# Patient Record
Sex: Female | Born: 1980 | Race: White | Hispanic: No | Marital: Married | Smoking: Former smoker
Health system: Southern US, Community
[De-identification: ages and names within clinical notes are randomized; demographics above are authoritative.]

## PROBLEM LIST (undated history)

## (undated) DIAGNOSIS — M5126 Other intervertebral disc displacement, lumbar region: Secondary | ICD-10-CM

## (undated) DIAGNOSIS — R519 Headache, unspecified: Secondary | ICD-10-CM

## (undated) DIAGNOSIS — M5116 Intervertebral disc disorders with radiculopathy, lumbar region: Secondary | ICD-10-CM

## (undated) DIAGNOSIS — D649 Anemia, unspecified: Secondary | ICD-10-CM

## (undated) DIAGNOSIS — R001 Bradycardia, unspecified: Secondary | ICD-10-CM

## (undated) DIAGNOSIS — E041 Nontoxic single thyroid nodule: Secondary | ICD-10-CM

## (undated) DIAGNOSIS — I10 Essential (primary) hypertension: Secondary | ICD-10-CM

## (undated) DIAGNOSIS — G8929 Other chronic pain: Secondary | ICD-10-CM

## (undated) DIAGNOSIS — M5136 Other intervertebral disc degeneration, lumbar region: Secondary | ICD-10-CM

## (undated) DIAGNOSIS — Z973 Presence of spectacles and contact lenses: Secondary | ICD-10-CM

## (undated) DIAGNOSIS — Z87442 Personal history of urinary calculi: Secondary | ICD-10-CM

## (undated) DIAGNOSIS — Z972 Presence of dental prosthetic device (complete) (partial): Secondary | ICD-10-CM

## (undated) DIAGNOSIS — Z8742 Personal history of other diseases of the female genital tract: Secondary | ICD-10-CM

## (undated) DIAGNOSIS — N200 Calculus of kidney: Secondary | ICD-10-CM

## (undated) DIAGNOSIS — R51 Headache: Secondary | ICD-10-CM

## (undated) DIAGNOSIS — R87619 Unspecified abnormal cytological findings in specimens from cervix uteri: Secondary | ICD-10-CM

## (undated) HISTORY — DX: Anemia, unspecified: D64.9

## (undated) HISTORY — DX: Headache: R51

## (undated) HISTORY — DX: Other chronic pain: G89.29

## (undated) HISTORY — DX: Unspecified abnormal cytological findings in specimens from cervix uteri: R87.619

## (undated) HISTORY — PX: WISDOM TOOTH EXTRACTION: SHX21

## (undated) HISTORY — DX: Calculus of kidney: N20.0

## (undated) HISTORY — PX: OTHER SURGICAL HISTORY: SHX169

## (undated) HISTORY — DX: Other intervertebral disc displacement, lumbar region: M51.26

## (undated) HISTORY — DX: Headache, unspecified: R51.9

## (undated) HISTORY — PX: CYSTOSCOPY W/ URETERAL STENT PLACEMENT: SHX1429

## (undated) HISTORY — DX: Other intervertebral disc degeneration, lumbar region: M51.36

---

## 2007-07-13 HISTORY — PX: CYSTOSCOPY WITH URETEROSCOPY, STONE BASKETRY AND STENT PLACEMENT: SHX6378

## 2013-01-24 ENCOUNTER — Emergency Department (HOSPITAL_COMMUNITY)
Admission: EM | Admit: 2013-01-24 | Discharge: 2013-01-25 | Disposition: A | Discharge status: Home / Self Care | Attending: Emergency Medicine | Admitting: Emergency Medicine

## 2013-01-24 ENCOUNTER — Encounter (HOSPITAL_COMMUNITY): Payer: Self-pay

## 2013-01-24 ENCOUNTER — Emergency Department (HOSPITAL_COMMUNITY)

## 2013-01-24 DIAGNOSIS — R1013 Epigastric pain: Secondary | ICD-10-CM | POA: Insufficient documentation

## 2013-01-24 DIAGNOSIS — R634 Abnormal weight loss: Secondary | ICD-10-CM | POA: Insufficient documentation

## 2013-01-24 DIAGNOSIS — Z880 Allergy status to penicillin: Secondary | ICD-10-CM | POA: Insufficient documentation

## 2013-01-24 DIAGNOSIS — Z87448 Personal history of other diseases of urinary system: Secondary | ICD-10-CM | POA: Insufficient documentation

## 2013-01-24 DIAGNOSIS — R109 Unspecified abdominal pain: Secondary | ICD-10-CM

## 2013-01-24 DIAGNOSIS — Z79899 Other long term (current) drug therapy: Secondary | ICD-10-CM | POA: Insufficient documentation

## 2013-01-24 DIAGNOSIS — Z3202 Encounter for pregnancy test, result negative: Secondary | ICD-10-CM | POA: Insufficient documentation

## 2013-01-24 DIAGNOSIS — R110 Nausea: Secondary | ICD-10-CM | POA: Insufficient documentation

## 2013-01-24 LAB — URINE MICROSCOPIC-ADD ON

## 2013-01-24 LAB — COMPREHENSIVE METABOLIC PANEL
ALT: 7 U/L (ref 0–35)
AST: 11 U/L (ref 0–37)
Albumin: 3.8 g/dL (ref 3.5–5.2)
Alkaline Phosphatase: 54 U/L (ref 39–117)
BUN: 12 mg/dL (ref 6–23)
CO2: 15 mEq/L — ABNORMAL LOW (ref 19–32)
Calcium: 9 mg/dL (ref 8.4–10.5)
Chloride: 106 mEq/L (ref 96–112)
Creatinine, Ser: 0.83 mg/dL (ref 0.50–1.10)
GFR calc Af Amer: >90 mL/min (ref >90)
GFR calc non Af Amer: >90 mL/min (ref >90)
Glucose, Bld: 80 mg/dL (ref 70–99)
Potassium: 3.2 mEq/L — ABNORMAL LOW (ref 3.5–5.1)
Sodium: 137 mEq/L (ref 135–145)
Total Bilirubin: 0.2 mg/dL — ABNORMAL LOW (ref 0.3–1.2)
Total Protein: 6.8 g/dL (ref 6.0–8.3)

## 2013-01-24 LAB — CBC WITH DIFFERENTIAL/PLATELET
Basophils Absolute: 0 10*3/uL (ref 0.0–0.1)
Basophils Relative: 0 % (ref 0–1)
Eosinophils Absolute: 0.2 10*3/uL (ref 0.0–0.7)
Eosinophils Relative: 1 % (ref 0–5)
HCT: 32.2 % — ABNORMAL LOW (ref 36.0–46.0)
Hemoglobin: 10.8 g/dL — ABNORMAL LOW (ref 12.0–15.0)
Lymphocytes Relative: 18 % (ref 12–46)
Lymphs Abs: 2.7 10*3/uL (ref 0.7–4.0)
MCH: 29.4 pg (ref 26.0–34.0)
MCHC: 33.5 g/dL (ref 30.0–36.0)
MCV: 87.7 fL (ref 78.0–100.0)
Monocytes Absolute: 1.2 10*3/uL — ABNORMAL HIGH (ref 0.1–1.0)
Monocytes Relative: 8 % (ref 3–12)
Neutro Abs: 10.8 10*3/uL — ABNORMAL HIGH (ref 1.7–7.7)
Neutrophils Relative %: 73 % (ref 43–77)
Platelets: 325 10*3/uL (ref 150–400)
RBC: 3.67 MIL/uL — ABNORMAL LOW (ref 3.87–5.11)
RDW: 14.9 % (ref 11.5–15.5)
WBC: 14.9 10*3/uL — ABNORMAL HIGH (ref 4.0–10.5)

## 2013-01-24 LAB — POCT PREGNANCY, URINE: Preg Test, Ur: NEGATIVE

## 2013-01-24 LAB — URINALYSIS, ROUTINE W REFLEX MICROSCOPIC
Bilirubin Urine: NEGATIVE
Ketones, ur: NEGATIVE mg/dL
Nitrite: NEGATIVE
Urobilinogen, UA: 0.2 mg/dL (ref 0.0–1.0)

## 2013-01-24 LAB — LIPASE, BLOOD: Lipase: 54 U/L (ref 11–59)

## 2013-01-24 MED ORDER — MORPHINE SULFATE 4 MG/ML IJ SOLN
6.0000 mg | Freq: Once | INTRAMUSCULAR | Status: AC
  Administered 2013-01-24: 6 mg via INTRAVENOUS
  Filled 2013-01-24: qty 2

## 2013-01-24 MED ORDER — IOHEXOL 300 MG/ML  SOLN
50.0000 mL | Freq: Once | INTRAMUSCULAR | Status: AC | PRN
  Administered 2013-01-24: 50 mL via ORAL

## 2013-01-24 MED ORDER — IOHEXOL 300 MG/ML  SOLN
100.0000 mL | Freq: Once | INTRAMUSCULAR | Status: AC | PRN
  Administered 2013-01-24: 80 mL via INTRAVENOUS

## 2013-01-24 MED ORDER — KETOROLAC TROMETHAMINE 30 MG/ML IJ SOLN
15.0000 mg | Freq: Once | INTRAMUSCULAR | Status: AC
  Administered 2013-01-24: 15 mg via INTRAVENOUS
  Filled 2013-01-24: qty 1

## 2013-01-24 MED ORDER — ONDANSETRON HCL 4 MG/2ML IJ SOLN
4.0000 mg | Freq: Once | INTRAMUSCULAR | Status: AC
  Administered 2013-01-24: 4 mg via INTRAVENOUS
  Filled 2013-01-24: qty 2

## 2013-01-24 MED ORDER — GI COCKTAIL ~~LOC~~
30.0000 mL | Freq: Once | ORAL | Status: AC
  Administered 2013-01-24: 30 mL via ORAL
  Filled 2013-01-24: qty 30

## 2013-01-24 MED ORDER — MORPHINE SULFATE 4 MG/ML IJ SOLN
4.0000 mg | Freq: Once | INTRAMUSCULAR | Status: AC
  Administered 2013-01-24: 4 mg via INTRAVENOUS
  Filled 2013-01-24: qty 1

## 2013-01-24 MED ORDER — SODIUM CHLORIDE 0.9 % IV BOLUS (SEPSIS)
1000.0000 mL | Freq: Once | INTRAVENOUS | Status: AC
  Administered 2013-01-24: 1000 mL via INTRAVENOUS

## 2013-01-24 NOTE — ED Notes (Signed)
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 Denies vomiting or diarrhea, has had nausea.

## 2013-01-24 NOTE — ED Notes (Signed)
Pt complains of severe abd pain that radiates to her back immediately after eating this afternoon, no vomiting but nauseated

## 2013-01-24 NOTE — ED Provider Notes (Signed)
History     Christina Stone is a pleasant 32 year old female presenting with abdominal pain. Onset last night. Happened within minutes after eating. Severe pain in the epigastric region with radiation to her back. Pain has been waxing and waning since then, but has not completely gone away. 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 Her gallbladder and common bile duct was normal in appearance to her, but possibly did have some fluid around her pancreas. Nausea but no vomiting. No significant alcohol use. Approximately 15 pound unintentional weight loss over the past 6 weeks. No urinary complaints.   CSN: [id number] Arrival date & time 01/24/13  1958  First MD Initiated Contact with Patient 01/24/13 2037     Chief Complaint  Patient presents with  . Abdominal Pain   (Consider location/radiation/quality/duration/timing/severity/associated sxs/prior Treatment) HPI Past Medical History  Diagnosis Date  . Renal disorder    History reviewed. No pertinent past surgical history. History reviewed. No pertinent family history. History  Substance Use Topics  . Smoking status: Not on file  . Smokeless tobacco: Not on file  . Alcohol Use: No   OB History   Grav Para Term Preterm Abortions TAB SAB Ect Mult Living                 Review of Systems  All systems reviewed and negative, other than as noted in HPI.   Allergies  Biaxin and Penicillins  Home Medications   Current Outpatient Rx  Name  Route  Sig  Dispense  Refill  . acetaminophen (TYLENOL) 500 MG tablet   Oral   Take 1,000 mg by mouth every 6 (six) hours as needed for pain.         . famotidine (PEPCID) 20 MG tablet   Oral   Take 20 mg by mouth 2 (two) times daily.         Marland Kitchen ibuprofen (ADVIL,MOTRIN) 200 MG tablet   Oral   Take 400 mg by mouth every 6 (six) hours as needed for pain.          BP 124/91  Pulse 96  Temp(Src) 98.1 F (36.7 C) (Oral)  Resp 20  SpO2 100%  LMP 01/09/2013 Physical  Exam  Nursing note and vitals reviewed. Constitutional: She appears well-developed and well-nourished. No distress.  HENT:  Head: Normocephalic and atraumatic.  Eyes: Conjunctivae are normal. Right eye exhibits no discharge. Left eye exhibits no discharge.  Neck: Neck supple.  Cardiovascular: Normal rate, regular rhythm and normal heart sounds.  Exam reveals no gallop and no friction rub.   No murmur heard. Pulmonary/Chest: Effort normal and breath sounds normal. No respiratory distress.  Abdominal: Soft. She exhibits no distension. There is tenderness. There is no rebound and no guarding.  Tenderness in epigastrium without rebound or guarding. No distention. No masses palpated.  Genitourinary:  No costovertebral angle tenderness  Musculoskeletal: She exhibits no edema and no tenderness.  Neurological: She is alert.  Skin: Skin is warm and dry.  Psychiatric: She has a normal mood and affect. Her behavior is normal. Thought content normal.    ED Course  Procedures (including critical care time) Labs Reviewed  URINALYSIS, ROUTINE W REFLEX MICROSCOPIC - Abnormal; Notable for the following:    Specific Gravity, Urine 1.035 (*)    Hgb urine dipstick LARGE (*)    Protein, ur 30 (*)    Leukocytes, UA SMALL (*)    All other components within normal limits  COMPREHENSIVE METABOLIC PANEL -  Abnormal; Notable for the following:    Potassium 3.2 (*)    CO2 15 (*)    Total Bilirubin 0.2 (*)    All other components within normal limits  CBC WITH DIFFERENTIAL - Abnormal; Notable for the following:    WBC 14.9 (*)    RBC 3.67 (*)    Hemoglobin 10.8 (*)    HCT 32.2 (*)    Neutro Abs 10.8 (*)    Monocytes Absolute 1.2 (*)    All other components within normal limits  URINE MICROSCOPIC-ADD ON - Abnormal; Notable for the following:    Squamous Epithelial / LPF MANY (*)    Bacteria, UA FEW (*)    All other components within normal limits  URINE CULTURE  LIPASE, BLOOD  POCT PREGNANCY, URINE    Ct Abdomen Pelvis W Contrast  01/24/2013   *RADIOLOGY REPORT*  Clinical Data: Sharp mid abdominal pain.  Nausea.  CT ABDOMEN AND PELVIS WITH CONTRAST  Technique:  Multidetector CT imaging of the abdomen and pelvis was performed following the standard protocol during bolus administration of intravenous contrast.  Contrast: 80 mL of Omnipaque 300 IV contrast  Comparison: None.  Findings: The visualized lung bases are clear.  There is mild diffuse prominence of the intrahepatic biliary ducts. The gallbladder is also distended.  Trace fluid is noted about the gallbladder.  Findings raise concern for distal obstruction, though the common hepatic duct remains normal in caliber, measuring 4 mm in diameter.  Scattered tiny hypodensities within the liver are nonspecific and may reflect small cysts.  No dominant mass is identified.  The spleen is unremarkable in appearance.  The pancreas and adrenal glands are grossly unremarkable.  No definite free fluid is seen about the pancreas.  A 6 mm cyst is noted at the upper pole of the right kidney.  A 4 mm nonobstructing stone is noted at the lower pole of the left kidney. No obstructing gallstones are seen.  There is no evidence of hydronephrosis.  The small bowel is unremarkable in appearance.  The stomach is within normal limits.  No acute vascular abnormalities are seen.  The appendix is normal in caliber, without evidence for appendicitis.  The colon is unremarkable in appearance.  The bladder is mildly distended and grossly unremarkable in appearance.  The uterus is within normal limits.  A 2.8 cm left adnexal cyst is likely physiologic in nature.  The ovaries are otherwise grossly unremarkable in appearance.  No suspicious adnexal masses are seen.  No inguinal lymphadenopathy is seen.  No acute osseous abnormalities are identified.  IMPRESSION:  1.  Mild diffuse prominence of the intrahepatic biliary ducts, with distension of the gallbladder.  Trace fluid noted about  the gallbladder.  Findings raise concern for distal obstruction, though the common hepatic duct remains normal in caliber.  No definite distal stone or mass seen on CT; this could simply reflect the patient's baseline.  MRCP would be helpful for further evaluation, when and as deemed clinically appropriate. 2.  Scattered tiny hypodensities in the liver are nonspecific and may reflect small cysts.  No dominant mass seen. 3.  Small right renal cyst and small nonobstructing left renal stone seen.   Original Report Authenticated By: Tonia Ghent, M.D.   1. Abdominal pain     MDM   32yF with abdominal pain which radiates to back. Per her report, normal GB and CBD but possible peripancreatic fluid. Location of pain/radiation and symptoms certainly could be from pancreatitis, although pt with  no apparent risk factors. Also consider gastritis, PUD, biliary dyskinesia, hepatitis, etc. Plan symptomatic tx with narcotics, antiemetics, IVF. CMP, CBC, Lipase, UA. CT a/p.   W/u fairly unremarkable. Some intrahepatic biliary ductal dilation with normal caliber CBD. Unsure of significance of this if any. LFTs/bili normal. Lipase normal. GB distended w/o trace fluid. Pt afebrile. Does have leukocytosis, but this nonspecific. Not peritoneal on exam. Will give trial of PPI for possible PUD. PRN pain meds/antiemetics. GI follow-up. Pt to establish PCP as well. Emergent return precautions discussed.   Raeford Razor, MD 01/25/13 [phone]

## 2013-01-25 LAB — URINE CULTURE
Colony Count: NO GROWTH
Culture: NO GROWTH

## 2013-01-25 MED ORDER — ONDANSETRON HCL 4 MG PO TABS: 4.0000 mg | ORAL_TABLET | Freq: Three times a day (TID) | ORAL | Status: DC | PRN

## 2013-01-25 MED ORDER — PANTOPRAZOLE SODIUM 20 MG PO TBEC: 20.0000 mg | DELAYED_RELEASE_TABLET | Freq: Every day | ORAL | Status: DC

## 2013-01-25 MED ORDER — OXYCODONE-ACETAMINOPHEN 5-325 MG PO TABS: 1.0000 | ORAL_TABLET | ORAL | Status: DC | PRN

## 2013-02-28 ENCOUNTER — Encounter (HOSPITAL_COMMUNITY): Payer: Self-pay | Admitting: Emergency Medicine

## 2013-02-28 ENCOUNTER — Emergency Department (HOSPITAL_COMMUNITY)
Admission: EM | Admit: 2013-02-28 | Discharge: 2013-02-28 | Disposition: A | Discharge status: Home / Self Care | Attending: Emergency Medicine | Admitting: Emergency Medicine

## 2013-02-28 DIAGNOSIS — R110 Nausea: Secondary | ICD-10-CM | POA: Insufficient documentation

## 2013-02-28 DIAGNOSIS — Z880 Allergy status to penicillin: Secondary | ICD-10-CM | POA: Insufficient documentation

## 2013-02-28 DIAGNOSIS — R002 Palpitations: Secondary | ICD-10-CM | POA: Insufficient documentation

## 2013-02-28 DIAGNOSIS — Z79899 Other long term (current) drug therapy: Secondary | ICD-10-CM | POA: Insufficient documentation

## 2013-02-28 DIAGNOSIS — R1013 Epigastric pain: Secondary | ICD-10-CM | POA: Insufficient documentation

## 2013-02-28 DIAGNOSIS — Z87448 Personal history of other diseases of urinary system: Secondary | ICD-10-CM | POA: Insufficient documentation

## 2013-02-28 DIAGNOSIS — R109 Unspecified abdominal pain: Secondary | ICD-10-CM

## 2013-02-28 DIAGNOSIS — Z8719 Personal history of other diseases of the digestive system: Secondary | ICD-10-CM | POA: Insufficient documentation

## 2013-02-28 DIAGNOSIS — R509 Fever, unspecified: Secondary | ICD-10-CM | POA: Insufficient documentation

## 2013-02-28 LAB — COMPREHENSIVE METABOLIC PANEL
ALT: 10 U/L (ref 0–35)
AST: 19 U/L (ref 0–37)
Alkaline Phosphatase: 56 U/L (ref 39–117)
CO2: 18 mEq/L — ABNORMAL LOW (ref 19–32)
Chloride: 100 mEq/L (ref 96–112)
GFR calc non Af Amer: >90 mL/min (ref >90)
Potassium: 3.9 mEq/L (ref 3.5–5.1)
Sodium: 133 mEq/L — ABNORMAL LOW (ref 135–145)
Total Bilirubin: 0.3 mg/dL (ref 0.3–1.2)

## 2013-02-28 LAB — CBC
HCT: 38.9 % (ref 36.0–46.0)
Hemoglobin: 12.6 g/dL (ref 12.0–15.0)
MCHC: 32.4 g/dL (ref 30.0–36.0)

## 2013-02-28 MED ORDER — ONDANSETRON HCL 4 MG PO TABS: 4.0000 mg | ORAL_TABLET | Freq: Four times a day (QID) | ORAL | Status: DC

## 2013-02-28 MED ORDER — LORAZEPAM 2 MG/ML IJ SOLN
0.5000 mg | Freq: Once | INTRAMUSCULAR | Status: AC
  Administered 2013-02-28: 0.5 mg via INTRAVENOUS
  Filled 2013-02-28: qty 1

## 2013-02-28 MED ORDER — SODIUM CHLORIDE 0.9 % IV BOLUS (SEPSIS)
1000.0000 mL | Freq: Once | INTRAVENOUS | Status: AC
  Administered 2013-02-28: 1000 mL via INTRAVENOUS

## 2013-02-28 MED ORDER — OXYCODONE-ACETAMINOPHEN 5-325 MG PO TABS: 1.0000 | ORAL_TABLET | Freq: Four times a day (QID) | ORAL | Status: DC | PRN

## 2013-02-28 MED ORDER — MORPHINE SULFATE 4 MG/ML IJ SOLN
4.0000 mg | Freq: Once | INTRAMUSCULAR | Status: AC
  Administered 2013-02-28: 4 mg via INTRAVENOUS
  Filled 2013-02-28: qty 1

## 2013-02-28 MED ORDER — ONDANSETRON HCL 4 MG/2ML IJ SOLN
4.0000 mg | Freq: Once | INTRAMUSCULAR | Status: AC
  Administered 2013-02-28: 4 mg via INTRAVENOUS
  Filled 2013-02-28: qty 2

## 2013-02-28 NOTE — ED Provider Notes (Signed)
Medical screening examination/treatment/procedure(s) were performed by non-physician practitioner and as supervising physician I was immediately available for consultation/collaboration.  Lyanne Co, MD 02/28/13 [phone]

## 2013-02-28 NOTE — ED Provider Notes (Signed)
CSN: [id number]     Arrival date & time 02/28/13  1213 History     First MD Initiated Contact with Patient 02/28/13 1250     Chief Complaint  Patient presents with  . Chest Pain  . Palpitations   (Consider location/radiation/quality/duration/timing/severity/associated sxs/prior Treatment) HPI Comments: Patient presents with intermittent epigastric pain with radiation to the back since last night. Describes pain as sharp and burning, worse after eating. This morning at work she felt like "her heart was beating out of her chest" and decided to come to the ER. Also admits to nausea and low grade fever at home. No one else has been sick at home. Last bowel movement yesterday afternoon. Denies chest pain, SOB, headache, blurred vision, weakness/dizziness, swelling of hands/feet, vomiting, diarrhea, melena, hematochezia, dysuria, or hematuria. Pt has history of gallbladder issues, currently trying to get appointment with GI for possible ERCP.   Patient is a 32 y.o. female presenting with chest pain and palpitations.  Chest Pain Associated symptoms: palpitations   Palpitations Associated symptoms: chest pain     Past Medical History  Diagnosis Date  . Renal disorder   . Gallstones    No past surgical history on file. No family history on file. History  Substance Use Topics  . Smoking status: Not on file  . Smokeless tobacco: Not on file  . Alcohol Use: No   OB History   Grav Para Term Preterm Abortions TAB SAB Ect Mult Living                 Review of Systems  Cardiovascular: Positive for chest pain and palpitations.    Allergies  Biaxin and Penicillins  Home Medications   Current Outpatient Rx  Name  Route  Sig  Dispense  Refill  . acetaminophen (TYLENOL) 500 MG tablet   Oral   Take 1,000 mg by mouth every 6 (six) hours as needed for pain.         . famotidine (PEPCID) 20 MG tablet   Oral   Take 20 mg by mouth 2 (two) times daily.         Marland Kitchen ibuprofen  (ADVIL,MOTRIN) 200 MG tablet   Oral   Take 400 mg by mouth every 6 (six) hours as needed for pain.         Marland Kitchen ondansetron (ZOFRAN) 4 MG tablet   Oral   Take 1 tablet (4 mg total) by mouth every 8 (eight) hours as needed for nausea.   20 tablet   0   . oxyCODONE-acetaminophen (PERCOCET/ROXICET) 5-325 MG per tablet   Oral   Take 1-2 tablets by mouth every 4 (four) hours as needed for pain.   20 tablet   0   . pantoprazole (PROTONIX) 20 MG tablet   Oral   Take 1 tablet (20 mg total) by mouth daily.   30 tablet   0   . ondansetron (ZOFRAN) 4 MG tablet   Oral   Take 1 tablet (4 mg total) by mouth every 6 (six) hours.   12 tablet   0   . oxyCODONE-acetaminophen (PERCOCET/ROXICET) 5-325 MG per tablet   Oral   Take 1 tablet by mouth every 6 (six) hours as needed for pain.   20 tablet   0    BP 131/87  Pulse 82  Temp(Src) 98.1 F (36.7 C) (Oral)  Resp 18  SpO2 100%  LMP 02/05/2013 Physical Exam  Nursing note and vitals reviewed. Constitutional: She appears well-developed and well-nourished.  No distress.  HENT:  Head: Normocephalic and atraumatic.   HEENT: Anicteric.  No pallor.  No discharge from ears, eyes, nose, or mouth.   Eyes: Pupils are equal, round, and reactive to light.  Neck: Normal range of motion. Neck supple.  Cardiovascular: Normal rate and regular rhythm.   Pulmonary/Chest: Effort normal.  Abdominal: Soft. There is tenderness in the epigastric area. There is no rigidity, no rebound, no guarding, no tenderness at McBurney's point and negative Murphy's sign.    Neurological: She is alert.  Skin: Skin is warm and dry.    ED Course   Procedures (including critical care time)  Labs Reviewed  COMPREHENSIVE METABOLIC PANEL - Abnormal; Notable for the following:    Sodium 133 (*)    CO2 18 (*)    All other components within normal limits  CBC  LIPASE, BLOOD  POCT I-STAT TROPONIN I   No results found. 1. Abdominal pain     MDM   Patient  tachycardic appeared to be anxious. Given 0.5mg  Ativan IV and her heart rate returned to normal at 85 bpm.  Her labs are unremarkable and is well controlled, no labs needed at this time. I spoke with with our GI about getting her MRCP done and they will call her tomorrow to schedule the appointment. Patient is happy about this as she has been struggling to get into another provider.  32 y.o.Emmit Alexanders evaluation in the Emergency Department is complete. It has been determined that no acute conditions requiring further emergency intervention are present at this time. The patient/guardian have been advised of the diagnosis and plan. We have discussed signs and symptoms that warrant return to the ED, such as changes or worsening in symptoms.  Vital signs are stable at discharge. Filed Vitals:   02/28/13 1417  BP: 131/87  Pulse: 82  Temp:   Resp: 18    Patient/guardian has voiced understanding and agreed to follow-up with the PCP or specialist.   Dorthula Matas, PA-C 02/28/13 1459

## 2013-02-28 NOTE — ED Notes (Signed)
Pt states she knows that she has gallbladder issues and could not sleep last night due to upper abd/chest pain.  Pt states that when she got to work this morning, she felt like her heart was pounding out of her chest.  States she has been nauseated but denies vomiting or diarrhea.

## 2013-02-28 NOTE — Progress Notes (Signed)
During 02/28/13 ed visit pt confirmed [facility]cp is [facility] - epic updated

## 2013-04-03 ENCOUNTER — Encounter: Payer: Self-pay | Admitting: Internal Medicine

## 2013-04-18 ENCOUNTER — Encounter: Payer: Self-pay | Admitting: Internal Medicine

## 2013-04-19 ENCOUNTER — Encounter (HOSPITAL_COMMUNITY): Payer: Self-pay | Admitting: Emergency Medicine

## 2013-04-19 ENCOUNTER — Emergency Department (HOSPITAL_COMMUNITY)
Admission: EM | Admit: 2013-04-19 | Discharge: 2013-04-19 | Disposition: A | Discharge status: Home / Self Care | Attending: Emergency Medicine | Admitting: Emergency Medicine

## 2013-04-19 ENCOUNTER — Emergency Department (HOSPITAL_COMMUNITY)

## 2013-04-19 DIAGNOSIS — M549 Dorsalgia, unspecified: Secondary | ICD-10-CM | POA: Insufficient documentation

## 2013-04-19 DIAGNOSIS — Z8719 Personal history of other diseases of the digestive system: Secondary | ICD-10-CM | POA: Insufficient documentation

## 2013-04-19 DIAGNOSIS — Z87448 Personal history of other diseases of urinary system: Secondary | ICD-10-CM | POA: Insufficient documentation

## 2013-04-19 DIAGNOSIS — R209 Unspecified disturbances of skin sensation: Secondary | ICD-10-CM | POA: Insufficient documentation

## 2013-04-19 DIAGNOSIS — F17200 Nicotine dependence, unspecified, uncomplicated: Secondary | ICD-10-CM | POA: Insufficient documentation

## 2013-04-19 MED ORDER — NAPROXEN 500 MG PO TABS: 500.0000 mg | ORAL_TABLET | Freq: Two times a day (BID) | ORAL | Status: DC

## 2013-04-19 MED ORDER — METHOCARBAMOL 500 MG PO TABS: 500.0000 mg | ORAL_TABLET | Freq: Two times a day (BID) | ORAL | Status: DC

## 2013-04-19 MED ORDER — OXYCODONE-ACETAMINOPHEN 5-325 MG PO TABS
1.0000 | ORAL_TABLET | Freq: Once | ORAL | Status: AC
  Administered 2013-04-19: 1 via ORAL
  Filled 2013-04-19: qty 1

## 2013-04-19 NOTE — ED Provider Notes (Signed)
CSN: [id number]     Arrival date & time 04/19/13  1257 History  This chart was scribed for non-physician practitioner working with Shon Baton, MD by Ashley Jacobs, ED scribe. This patient was seen in room WTR5/WTR5 and the patient's care was started at 2:39 PM   First MD Initiated Contact with Patient 04/19/13 1326     Chief Complaint  Patient presents with  . Back Pain   (Consider location/radiation/quality/duration/timing/severity/associated sxs/prior Treatment) The history is provided by the patient and medical records.   HPI Comments: Christina Stone is a 32 y.o. female who arrives to the Emergency Department complaining of sharp, constant, midline back pain that presented three days ago with sudden onset. She reports that pain radiates mostly to the right side but also to the left. Pt mentions the associated symptoms of numbness and paraesthesia in her distal phalanges (5-6 minutes intervals for less than 10 episodes).Pt is allergic to Biaxin and Penicillins. She has a medical hx of renal disorder and gallstones. She denies lifting heavy objects and prior injuries, chest pain, shortness of breath, difficulty breathing, neck pain, neck stiffness, urinary and bowel incontinence, fever, chills, weakness, syncope, dizziness, weakness. Pt denies IV drug use. She denies a hx of HTN Pt smokes a fourth of a pack of cigarettes a day.   Past Medical History  Diagnosis Date  . Renal disorder   . Gallstones    Past Surgical History  Procedure Laterality Date  . Stents in kidneys     No family history on file. History  Substance Use Topics  . Smoking status: Current Every Day Smoker -- 0.25 packs/day  . Smokeless tobacco: Never Used  . Alcohol Use: No   OB History   Grav Para Term Preterm Abortions TAB SAB Ect Mult Living                 Review of Systems  HENT: Negative for trouble swallowing.   Eyes: Negative for visual disturbance.  Respiratory: Negative for shortness of  breath.   Cardiovascular: Negative for chest pain.  Gastrointestinal: Negative for nausea, vomiting and diarrhea.  Genitourinary: Negative for dysuria and difficulty urinating.  Musculoskeletal: Positive for back pain.  Neurological: Positive for numbness. Negative for dizziness, weakness and light-headedness.  All other systems reviewed and are negative.    Allergies  Biaxin and Penicillins  Home Medications   Current Outpatient Rx  Name  Route  Sig  Dispense  Refill  . ibuprofen (ADVIL,MOTRIN) 200 MG tablet   Oral   Take 400 mg by mouth every 6 (six) hours as needed for pain.         . naproxen sodium (ANAPROX) 220 MG tablet   Oral   Take 220 mg by mouth 2 (two) times daily with a meal.         . methocarbamol (ROBAXIN) 500 MG tablet   Oral   Take 1 tablet (500 mg total) by mouth 2 (two) times daily.   20 tablet   0   . naproxen (NAPROSYN) 500 MG tablet   Oral   Take 1 tablet (500 mg total) by mouth 2 (two) times daily.   30 tablet   0    BP 111/69  Pulse 84  Temp(Src) 98.7 F (37.1 C) (Oral)  Resp 16  SpO2 98%  LMP 04/12/2013 Physical Exam  Nursing note and vitals reviewed. Constitutional: She is oriented to person, place, and time. She appears well-developed and well-nourished. No distress.  HENT:  Head:  Normocephalic and atraumatic.  Mouth/Throat: Oropharynx is clear and moist.  Neck: Normal range of motion. Neck supple. No tracheal deviation present.  Negative neck stiffness Negative nuchal rigidity  Cardiovascular: Normal rate, regular rhythm, normal heart sounds and intact distal pulses.   No murmur heard. Pulses:      Radial pulses are 2+ on the right side, and 2+ on the left side.       Dorsalis pedis pulses are 2+ on the right side, and 2+ on the left side.  Pulmonary/Chest: Effort normal and breath sounds normal. No stridor. No respiratory distress. She has no wheezes. She has no rales.  Musculoskeletal: Normal range of motion. She exhibits  tenderness.       Back:  Full range of motion to upper and lower extremities bilaterally with negative discomfort or issues identified. Pain upon palpation to the T3-T4, approximately, of the right paraspinal region, between spine and right scapula-muscular nature  Lymphadenopathy:    She has no cervical adenopathy.  Neurological: She is alert and oriented to person, place, and time. No cranial nerve deficit. She exhibits normal muscle tone. Coordination normal.  Cranial nerves III through XII grossly intact Strength 5+/5+ upper and lower extremities bilaterally with resistance applied, equal distribution identified  Skin: Skin is warm and dry.  Psychiatric: She has a normal mood and affect. Her behavior is normal. Thought content normal.    ED Course  Procedures (including critical care time) DIAGNOSTIC STUDIES: Oxygen Saturation is 98% on room air, normal by my interpretation.    COORDINATION OF CARE: 2:42 PM Discussed course of care with pt which includes DG Chest 2 views. Pt understands and agrees.  3:49 PM Spoke and discussed imaging results with patient. Discussed the pain is most likely musculoskeletal in nature. Discussed plan for discharge.    Labs Review Labs Reviewed - No data to display Imaging Review Dg Chest 2 View  04/19/2013   CLINICAL DATA:  Chest pain. Smoker.  EXAM: CHEST  2 VIEW  COMPARISON:  None.  FINDINGS: Normal sized heart. Clear lungs. Minimal scoliosis.  IMPRESSION: No acute abnormality.   Electronically Signed   By: Gordan Payment M.D.   On: 04/19/2013 15:24    EKG Interpretation   None       MDM   1. Back pain    I personally performed the services described in this documentation, which was scribed in my presence. The recorded information has been reviewed and is accurate.  Patient presenting to emergency department with back pain that has been ongoing for approximately 3 days. Patient reports the pain is localized to the upper portion of her back  in between the spine and the scapula of the right side. Describes a sharp pain that is constant with radiation from right to left. Nothing makes the pain better or worse. Reports she's been stretching, using ice, using heat, Aleve without relief. Patient denied any falls, injury chest pain, shortness of breath, difficulty breathing, fainting, dizziness, urinary or bowel incontinence, urinary issues, bowel movement changes, neck pain, neck stiffness, fever, chills, weakness, heavy lifting. Denied IV drug abuse. Alert and oriented. Full range of motion to upper lower extremities bilaterally. Heart rate and rhythm normal. Lungs clear to auscultation bilaterally, doubt pneumothorax. Pulses palpable and strong, proximal and distal. Discomfort upon palpation to T3-T4, approximately, paraspinal region between the spine and the right scapula. Negative deformities, erythema, swelling identified. Strength intact to upper and lower extremities bilaterally with resistance applied, equal distribution. Negative neurological deficits  identified. Chest x-ray negative for pneumothorax, negative findings. Patient stable, afebrile. Doubt PE-negative recent travel, negative surgery, negative DVT history, negative hemoptysis, negative chest pain negative shortness of breath or difficulty breathing - PERC negative. Patient not tachycardic, negative drop in blood pressure, negative neurological deficits, negative fainting symptoms - doubt AAA. Doubt pneumothorax. Suspicion to be musculoskeletal in nature with pain upon palpation. Discharged patient with anti-inflammatories and muscle relaxers. Discussed with patient to rest and stay hydrated and avoid admission if activity. Referred patient to primary care provider and orthopedics. Discussed with patient to closely monitor symptoms and if symptoms are to worsen or change to report back to emergency department immediately - strict return instructions were given to the patient. Patient  agreed to plan of care, understood, all questions answered.       Raymon Mutton, PA-C 04/20/13 2116

## 2013-04-19 NOTE — ED Notes (Signed)
Per pt, central back pain starting Monday night that has not improved with ice packs, heat, and motrin. Pt states intermittent numbness to right pinky and ring finger. Pt denies any other symptoms at present time.

## 2013-04-21 NOTE — ED Provider Notes (Signed)
Medical screening examination/treatment/procedure(s) were performed by non-physician practitioner and as supervising physician I was immediately available for consultation/collaboration.  Courtney F Horton, MD 04/21/13 0701 

## 2013-04-24 ENCOUNTER — Encounter: Payer: Self-pay | Admitting: Internal Medicine

## 2013-04-24 ENCOUNTER — Ambulatory Visit (INDEPENDENT_AMBULATORY_CARE_PROVIDER_SITE_OTHER): Admitting: Internal Medicine

## 2013-04-24 VITALS — BP 102/64 | HR 96 | Ht 64.0 in | Wt 127.4 lb

## 2013-04-24 DIAGNOSIS — R1013 Epigastric pain: Secondary | ICD-10-CM

## 2013-04-24 DIAGNOSIS — R195 Other fecal abnormalities: Secondary | ICD-10-CM

## 2013-04-24 DIAGNOSIS — R109 Unspecified abdominal pain: Secondary | ICD-10-CM

## 2013-04-24 DIAGNOSIS — R933 Abnormal findings on diagnostic imaging of other parts of digestive tract: Secondary | ICD-10-CM

## 2013-04-24 DIAGNOSIS — R112 Nausea with vomiting, unspecified: Secondary | ICD-10-CM

## 2013-04-24 MED ORDER — TRAMADOL HCL 50 MG PO TABS: 50.0000 mg | ORAL_TABLET | Freq: Three times a day (TID) | ORAL | Status: DC | PRN

## 2013-04-24 MED ORDER — PANTOPRAZOLE SODIUM 40 MG PO TBEC: 40.0000 mg | DELAYED_RELEASE_TABLET | Freq: Two times a day (BID) | ORAL | Status: DC

## 2013-04-24 NOTE — Patient Instructions (Addendum)
You have been given a separate informational sheet regarding your tobacco use, the importance of quitting and local resources to help you quit. You have been scheduled for a HIDA scan at [facility] (1st floor) on 05/03/2013. Please arrive 15 minutes prior to your scheduled appointment at 9:00. Make certain not to have anything to eat or drink at least 6 hours prior to your test. Should this appointment date or time not work well for you, please call radiology scheduling at [phone].  _____________________________________________________________________ hepatobiliary (HIDA) scan is an imaging procedure used to diagnose problems in the liver, gallbladder and bile ducts. In the HIDA scan, a radioactive chemical or tracer is injected into a vein in your arm. The tracer is handled by the liver like bile. Bile is a fluid produced and excreted by your liver that helps your digestive system break down fats in the foods you eat. Bile is stored in your gallbladder and the gallbladder releases the bile when you eat a meal. A special nuclear medicine scanner (gamma camera) tracks the flow of the tracer from your liver into your gallbladder and small intestine.  During your HIDA scan  You'll be asked to change into a hospital gown before your HIDA scan begins. Your health care team will position you on a table, usually on your back. The radioactive tracer is then injected into a vein in your arm.The tracer travels through your bloodstream to your liver, where it's taken up by the bile-producing cells. The radioactive tracer travels with the bile from your liver into your gallbladder and through your bile ducts to your small intestine.You may feel some pressure while the radioactive tracer is injected into your vein. As you lie on the table, a special gamma camera is positioned over your abdomen taking pictures of the tracer as it moves through your body. The gamma camera takes pictures continually for about an  hour. You'll need to keep still during the HIDA scan. This can become uncomfortable, but you may find that you can lessen the discomfort by taking deep breaths and thinking about other things. Tell your health care team if you're uncomfortable. The radiologist will watch on a computer the progress of the radioactive tracer through your body. The HIDA scan may be stopped when the radioactive tracer is seen in the gallbladder and enters your small intestine. This typically takes about an hour. In some cases extra imaging will be performed if original images aren't satisfactory, if morphine is given to help visualize the gallbladder or if the medication CCK is given to look at the contraction of the gallbladder. This test typically takes 2 hours to complete. ________________________________________________________________________  Your physician has requested that you go to the basement for  lab work before leaving today.    We have sent the following medications to your pharmacy for you to pick up at your convenience: Ultram; take 1-2 tablets every 8 hours as needed. Protonix  Twice daily

## 2013-04-24 NOTE — Progress Notes (Signed)
Patient ID: Christina Stone, female   DOB: [birth year], 32 y.o.   MRN: [id number] HPI: Mrs. Emmer is a 32 yo female with PMH of kidney stones and headaches who is seen in consultation at the request of Dr. Ivory Broad for evaluation of epigastric pain, n/v.  She is here alone today. She reports since approximately July 2014 she has developed epigastric abdominal pain associated with nausea and vomiting. This pain can be quite severe and can radiate to the right upper abdomen and lower right chest. She denies true heartburn but has frequent belching and can belch up food particles.  She reports her nausea and vomiting and epigastric pain her definitely worse with eating. She also reports symptoms that can occur 3 or 4 in the morning. She reports sometimes it feels like it "swallowed a knife". She has used Rolaids in the middle of the night which "helped some". Initially she was seen in the emergency department for this pain and received a CT scan. There was a question of dilated bile ducts and so she sought referral. She was given Protonix 20 mg daily which seemed to help along with Pepcid and Zofran not which is she taking now. Her weight has been stable. She reports her bowel movements have been more loose than normal for her. She is having approximately 3-4 loose stools per day. Normal for her is one formed stool daily. She denies melena or blood in her stool. No lower abdominal pain.  She works as a Print production planner. She reports she has tested herself and found small amount of fluid around the gallbladder but no gallbladder wall thickness or dilated biliary ducts.  She is using naproxen on a daily basis for this pain without much benefit.  Past Medical History  Diagnosis Date  . Kidney stones   . Anemia   . Chronic headaches     Past Surgical History  Procedure Laterality Date  . Stents in kidneys      Current Outpatient Prescriptions  Medication Sig Dispense Refill  . ibuprofen  (ADVIL,MOTRIN) 200 MG tablet Take 400 mg by mouth every 6 (six) hours as needed for pain.      . methocarbamol (ROBAXIN) 500 MG tablet Take 1 tablet (500 mg total) by mouth 2 (two) times daily.  20 tablet  0  . naproxen (NAPROSYN) 500 MG tablet Take 1 tablet (500 mg total) by mouth 2 (two) times daily.  30 tablet  0  . naproxen sodium (ANAPROX) 220 MG tablet Take 220 mg by mouth 2 (two) times daily with a meal.      . pantoprazole (PROTONIX) 40 MG tablet Take 1 tablet (40 mg total) by mouth 2 (two) times daily.  120 tablet  3  . traMADol (ULTRAM) 50 MG tablet Take 1 tablet (50 mg total) by mouth every 8 (eight) hours as needed for pain.  45 tablet  0   No current facility-administered medications for this visit.    Allergies  Allergen Reactions  . Biaxin [Clarithromycin] Anaphylaxis  . Penicillins Anaphylaxis    Family History  Problem Relation Age of Onset  . Breast cancer Maternal Grandmother   . Breast cancer Maternal Aunt   . Breast cancer Maternal Grandmother     great grand    History  Substance Use Topics  . Smoking status: Current Every Day Smoker -- 0.25 packs/day    Types: Cigarettes  . Smokeless tobacco: Never Used  . Alcohol Use: No    ROS: As per history  of present illness, otherwise negative  BP 102/64  Pulse 96  Ht 5\' 4"  (1.626 m)  Wt 127 lb 6 oz (57.777 kg)  BMI 21.85 kg/m2  LMP 04/12/2013 Constitutional: Well-developed and well-nourished. No distress. HEENT: Normocephalic and atraumatic. Oropharynx is clear and moist. No oropharyngeal exudate. Conjunctivae are normal.  No scleral icterus. Neck: Neck supple. Trachea midline. Cardiovascular: Normal rate, regular rhythm and intact distal pulses. No M/R/G Pulmonary/chest: Effort normal and breath sounds normal. No wheezing, rales or rhonchi. Abdominal: Soft, epigastric and right upper quadrant tenderness without rebound or guarding, nondistended. Bowel sounds active throughout. No  hepatosplenomegaly. Extremities: no clubbing, cyanosis, or edema Lymphadenopathy: No cervical adenopathy noted. Neurological: Alert and oriented to person place and time. Skin: Skin is warm and dry. No rashes noted. Psychiatric: Normal mood and affect. Behavior is normal.  RELEVANT LABS AND IMAGING: CBC    Component Value Date/Time   WBC 7.9 02/28/2013 1320   RBC 4.41 02/28/2013 1320   HGB 12.6 02/28/2013 1320   HCT 38.9 02/28/2013 1320   PLT 347 02/28/2013 1320   MCV 88.2 02/28/2013 1320   MCH 28.6 02/28/2013 1320   MCHC 32.4 02/28/2013 1320   RDW 13.9 02/28/2013 1320   LYMPHSABS 2.7 01/24/2013 2139   MONOABS 1.2* 01/24/2013 2139   EOSABS 0.2 01/24/2013 2139   BASOSABS 0.0 01/24/2013 2139    CMP     Component Value Date/Time   NA 133* 02/28/2013 1320   K 3.9 02/28/2013 1320   CL 100 02/28/2013 1320   CO2 18* 02/28/2013 1320   GLUCOSE 75 02/28/2013 1320   BUN 13 02/28/2013 1320   CREATININE 0.75 02/28/2013 1320   CALCIUM 9.5 02/28/2013 1320   PROT 7.7 02/28/2013 1320   ALBUMIN 4.6 02/28/2013 1320   AST 19 02/28/2013 1320   ALT 10 02/28/2013 1320   ALKPHOS 56 02/28/2013 1320   BILITOT 0.3 02/28/2013 1320   GFRNONAA >90 02/28/2013 1320   GFRAA >90 02/28/2013 1320   CT ABDOMEN AND PELVIS WITH CONTRAST -- 01/24/2013   Technique:  Multidetector CT imaging of the abdomen and pelvis was performed following the standard protocol during bolus administration of intravenous contrast.   Contrast: 80 mL of Omnipaque 300 IV contrast   Comparison: None.   Findings: The visualized lung bases are clear.   There is mild diffuse prominence of the intrahepatic biliary ducts. The gallbladder is also distended.  Trace fluid is noted about the gallbladder.  Findings raise concern for distal obstruction, though the common hepatic duct remains normal in caliber, measuring 4 mm in diameter.   Scattered tiny hypodensities within the liver are nonspecific and may reflect small cysts.  No dominant mass is  identified.  The spleen is unremarkable in appearance.  The pancreas and adrenal glands are grossly unremarkable.  No definite free fluid is seen about the pancreas.   A 6 mm cyst is noted at the upper pole of the right kidney.  A 4 mm nonobstructing stone is noted at the lower pole of the left kidney. No obstructing gallstones are seen.  There is no evidence of hydronephrosis.   The small bowel is unremarkable in appearance.  The stomach is within normal limits.  No acute vascular abnormalities are seen.   The appendix is normal in caliber, without evidence for appendicitis.  The colon is unremarkable in appearance.   The bladder is mildly distended and grossly unremarkable in appearance.  The uterus is within normal limits.  A 2.8 cm left  adnexal cyst is likely physiologic in nature.  The ovaries are otherwise grossly unremarkable in appearance.  No suspicious adnexal masses are seen.  No inguinal lymphadenopathy is seen.   No acute osseous abnormalities are identified.   IMPRESSION:   1.  Mild diffuse prominence of the intrahepatic biliary ducts, with distension of the gallbladder.  Trace fluid noted about the gallbladder.  Findings raise concern for distal obstruction, though the common hepatic duct remains normal in caliber.  No definite distal stone or mass seen on CT; this could simply reflect the patient's baseline.   MRCP would be helpful for further evaluation, when and as deemed clinically appropriate. 2.  Scattered tiny hypodensities in the liver are nonspecific and may reflect small cysts.  No dominant mass seen. 3.  Small right renal cyst and small nonobstructing left renal stone seen.      ASSESSMENT/PLAN: 32 yo female with PMH of kidney stones and headaches who is seen in consultation at the request of Dr. Ivory Broad for evaluation of epigastric pain, n/v.   1.  Epigastric pain/nausea/vomiting -- her symptoms seem more acid peptic in nature than  biliary. Her hepatic function panel was completely normal with no evidence for common duct obstruction. Certainly biliary dyskinesia could cause similar symptoms, but ulcer-like dyspepsia is felt to be most likely. I would like to repeat labs today to include CBC, CMP, celiac panel, H. pylori stool antigen, and stool studies given her loose stools. We'll start her on high-dose acid suppression medicine with pantoprazole 40 mg twice daily. I would like her to discontinue NSAIDs. I will give her a prescription for tramadol to be used as needed and as directed for abdominal pain. I will go ahead and order a HIDA scan which can be canceled if symptoms improve dramatically with PPI alone. We discussed upper endoscopy today, which would be indicated if she fails to respond completely to PPI. She understands the plan and is happy to proceed in this manner. If symptoms worsen or change his S. to notify me and she voices understanding.  2.  Abnormal GI imaging -- CT suggested "mild diffuse prominence of the intrahepatic biliary ducts" which are nonspecific and have no clear explanation at this time. Her bilirubin alk phosphatase are very normal as was AST and ALT. I am repeating hepatic function panel today. As discussed above we may proceed with HIDA scan if she fails to respond completely to PPI. No stones were seen in the gallbladder on the CT.

## 2013-04-25 ENCOUNTER — Other Ambulatory Visit (INDEPENDENT_AMBULATORY_CARE_PROVIDER_SITE_OTHER)

## 2013-04-25 ENCOUNTER — Telehealth: Payer: Self-pay | Admitting: Internal Medicine

## 2013-04-25 DIAGNOSIS — R109 Unspecified abdominal pain: Secondary | ICD-10-CM

## 2013-04-25 DIAGNOSIS — R112 Nausea with vomiting, unspecified: Secondary | ICD-10-CM

## 2013-04-25 LAB — COMPREHENSIVE METABOLIC PANEL
CO2: 25 mEq/L (ref 19–32)
Calcium: 8.9 mg/dL (ref 8.4–10.5)
Chloride: 106 mEq/L (ref 96–112)
Creatinine, Ser: 0.7 mg/dL (ref 0.4–1.2)
GFR: 96.39 mL/min (ref >60.00)
Glucose, Bld: 73 mg/dL (ref 70–99)
Total Bilirubin: 0.5 mg/dL (ref 0.3–1.2)
Total Protein: 6.9 g/dL (ref 6.0–8.3)

## 2013-04-25 LAB — CBC
HCT: 31.5 % — ABNORMAL LOW (ref 36.0–46.0)
Hemoglobin: 10.5 g/dL — ABNORMAL LOW (ref 12.0–15.0)
RBC: 3.68 Mil/uL — ABNORMAL LOW (ref 3.87–5.11)
WBC: 9.4 10*3/uL (ref 4.5–10.5)

## 2013-04-25 LAB — IGA: IgA: 103 mg/dL (ref 68–378)

## 2013-04-25 MED ORDER — OXYCODONE-ACETAMINOPHEN 5-325 MG PO TABS: ORAL_TABLET | ORAL | Status: DC

## 2013-04-25 MED ORDER — HYDROCODONE-ACETAMINOPHEN 5-325 MG PO TABS: ORAL_TABLET | ORAL | Status: DC

## 2013-04-25 NOTE — Telephone Encounter (Signed)
Okay for hydrocodone 5-325 mg 1-2 tabs every 6 hours as needed for severe pain only D/C tramadol completely Proceed otherwise as discussed in clinic

## 2013-04-25 NOTE — Telephone Encounter (Signed)
Pt reports she has taken tramadol 3 times and each time she gets dizzy and loses her balance. When asked about other pain meds, Robaxin, she states she was told not to take any NSAIDS. When asked about pain meds in the past, she reports she was given percocet in ER; documented 20 pills x 2. Please advise. Thanks.

## 2013-04-25 NOTE — Telephone Encounter (Signed)
Informed pt Dr Rhea Belton ordered hydrocodone for her; pt will pick up the scrippt.

## 2013-05-03 ENCOUNTER — Encounter (HOSPITAL_COMMUNITY): Admission: RE | Admit: 2013-05-03 | Source: Ambulatory Visit

## 2013-05-17 ENCOUNTER — Telehealth: Payer: Self-pay | Admitting: Internal Medicine

## 2013-05-17 ENCOUNTER — Other Ambulatory Visit: Payer: Self-pay | Admitting: Internal Medicine

## 2013-05-17 DIAGNOSIS — R933 Abnormal findings on diagnostic imaging of other parts of digestive tract: Secondary | ICD-10-CM

## 2013-05-17 DIAGNOSIS — K838 Other specified diseases of biliary tract: Secondary | ICD-10-CM

## 2013-05-17 DIAGNOSIS — R109 Unspecified abdominal pain: Secondary | ICD-10-CM

## 2013-05-17 NOTE — Telephone Encounter (Signed)
Pt seen 04/24/13 for abdominal pain , n/v, had a CT that showed dilated bile ducts and MRCP recommended. She was placed on PPI and given a pain med and a HIDA is schedule for next week; r/s because something happened? On the 05/03/13 appt.  She states she has been taking the meds, Protonix and hydrocodone at night, but nothing is helping. She still wakes up in the middle of the night in pain and has episodes at times during the day. She has 2 more hydrocodone left. Pt wants to know if there is anything else she can do or just wait until the HIDA results are back? Thanks.

## 2013-05-17 NOTE — Telephone Encounter (Signed)
It is possible that MRI abd/MRCP could be performed more quickly (tomorrow), while waiting on HIDA. Imaging will be part of the puzzle (either HIDA or MRCP) Previous labs showed to evidence of obstruction.  I would repeat labs now (CBC,CMP, lipase, amylase, and H pylori IGG) Can refill hydrocodone, continue PPI Finally acid/peptic disease is possible, but I would have expected response to PPI.

## 2013-05-17 NOTE — Telephone Encounter (Signed)
lmom for pt to call back

## 2013-05-18 MED ORDER — HYDROCODONE-ACETAMINOPHEN 5-325 MG PO TABS: ORAL_TABLET | ORAL | Status: DC

## 2013-05-18 NOTE — Telephone Encounter (Signed)
Pt will [facility] drawn [facility]. MRI scheduled for Monday; pt will pick up instructions and the script. Pt stated understanding.

## 2013-05-21 ENCOUNTER — Other Ambulatory Visit: Payer: Self-pay | Admitting: Internal Medicine

## 2013-05-21 ENCOUNTER — Ambulatory Visit (HOSPITAL_COMMUNITY)
Admission: RE | Admit: 2013-05-21 | Discharge: 2013-05-21 | Disposition: A | Discharge status: Home / Self Care | Source: Ambulatory Visit | Attending: Internal Medicine | Admitting: Internal Medicine

## 2013-05-21 DIAGNOSIS — R933 Abnormal findings on diagnostic imaging of other parts of digestive tract: Secondary | ICD-10-CM

## 2013-05-21 DIAGNOSIS — K838 Other specified diseases of biliary tract: Secondary | ICD-10-CM

## 2013-05-21 DIAGNOSIS — R109 Unspecified abdominal pain: Secondary | ICD-10-CM | POA: Insufficient documentation

## 2013-05-21 DIAGNOSIS — N281 Cyst of kidney, acquired: Secondary | ICD-10-CM | POA: Insufficient documentation

## 2013-05-21 DIAGNOSIS — R110 Nausea: Secondary | ICD-10-CM | POA: Insufficient documentation

## 2013-05-21 MED ORDER — GADOBENATE DIMEGLUMINE 529 MG/ML IV SOLN
11.0000 mL | Freq: Once | INTRAVENOUS | Status: AC | PRN
  Administered 2013-05-21: 11 mL via INTRAVENOUS

## 2013-05-23 ENCOUNTER — Encounter (HOSPITAL_COMMUNITY)
Admission: RE | Admit: 2013-05-23 | Discharge: 2013-05-23 | Disposition: A | Discharge status: Home / Self Care | Source: Ambulatory Visit | Attending: Internal Medicine | Admitting: Internal Medicine

## 2013-05-23 DIAGNOSIS — R109 Unspecified abdominal pain: Secondary | ICD-10-CM | POA: Insufficient documentation

## 2013-05-23 DIAGNOSIS — R112 Nausea with vomiting, unspecified: Secondary | ICD-10-CM | POA: Insufficient documentation

## 2013-05-23 MED ORDER — TECHNETIUM TC 99M MEBROFENIN IV KIT
5.0000 | PACK | Freq: Once | INTRAVENOUS | Status: AC | PRN
  Administered 2013-05-23: 5 via INTRAVENOUS

## 2013-05-23 MED ORDER — SINCALIDE 5 MCG IJ SOLR
INTRAMUSCULAR | Status: AC
  Administered 2013-05-23: 1.18 ug via INTRAVENOUS
  Filled 2013-05-23: qty 5

## 2013-05-23 MED ORDER — STERILE WATER FOR INJECTION IJ SOLN
INTRAMUSCULAR | Status: AC
  Administered 2013-05-23: 5 mL via INTRAVENOUS
  Filled 2013-05-23: qty 10

## 2013-05-31 ENCOUNTER — Ambulatory Visit (HOSPITAL_COMMUNITY)

## 2013-06-27 ENCOUNTER — Telehealth: Payer: Self-pay | Admitting: Gastroenterology

## 2013-06-27 NOTE — Telephone Encounter (Signed)
Called pt about a request we received via mychart for Vicodin. Pt said she is feeling great and did not request medication. I told her I will disregard the request.

## 2013-10-13 IMAGING — CR DG CHEST 2V
2 series · 2 of 2 positions shown · non-contrast
Comparison: None.

CLINICAL DATA: Chest pain. Smoker.

EXAM:
CHEST  2 VIEW

[w chest pa]
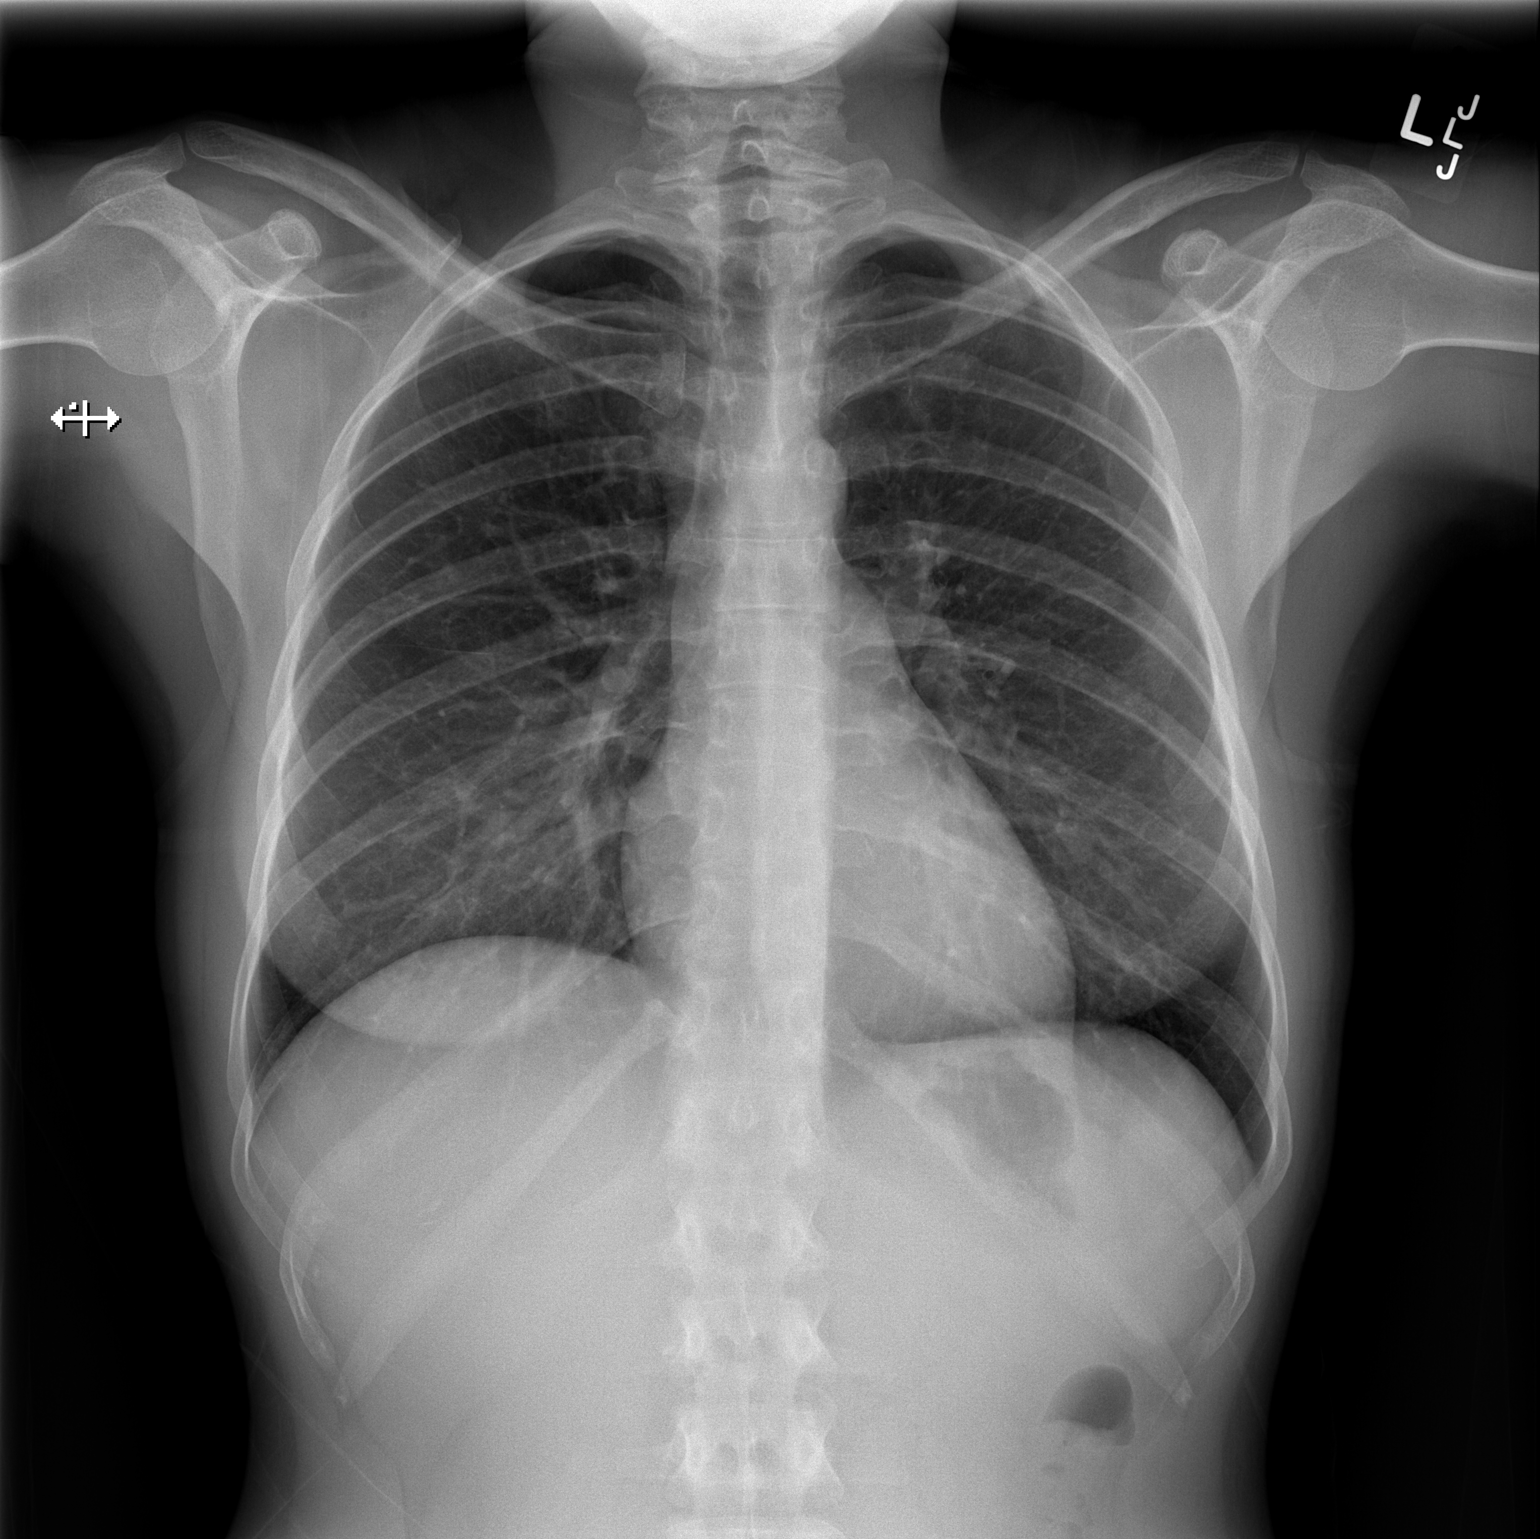

[w chest lat]
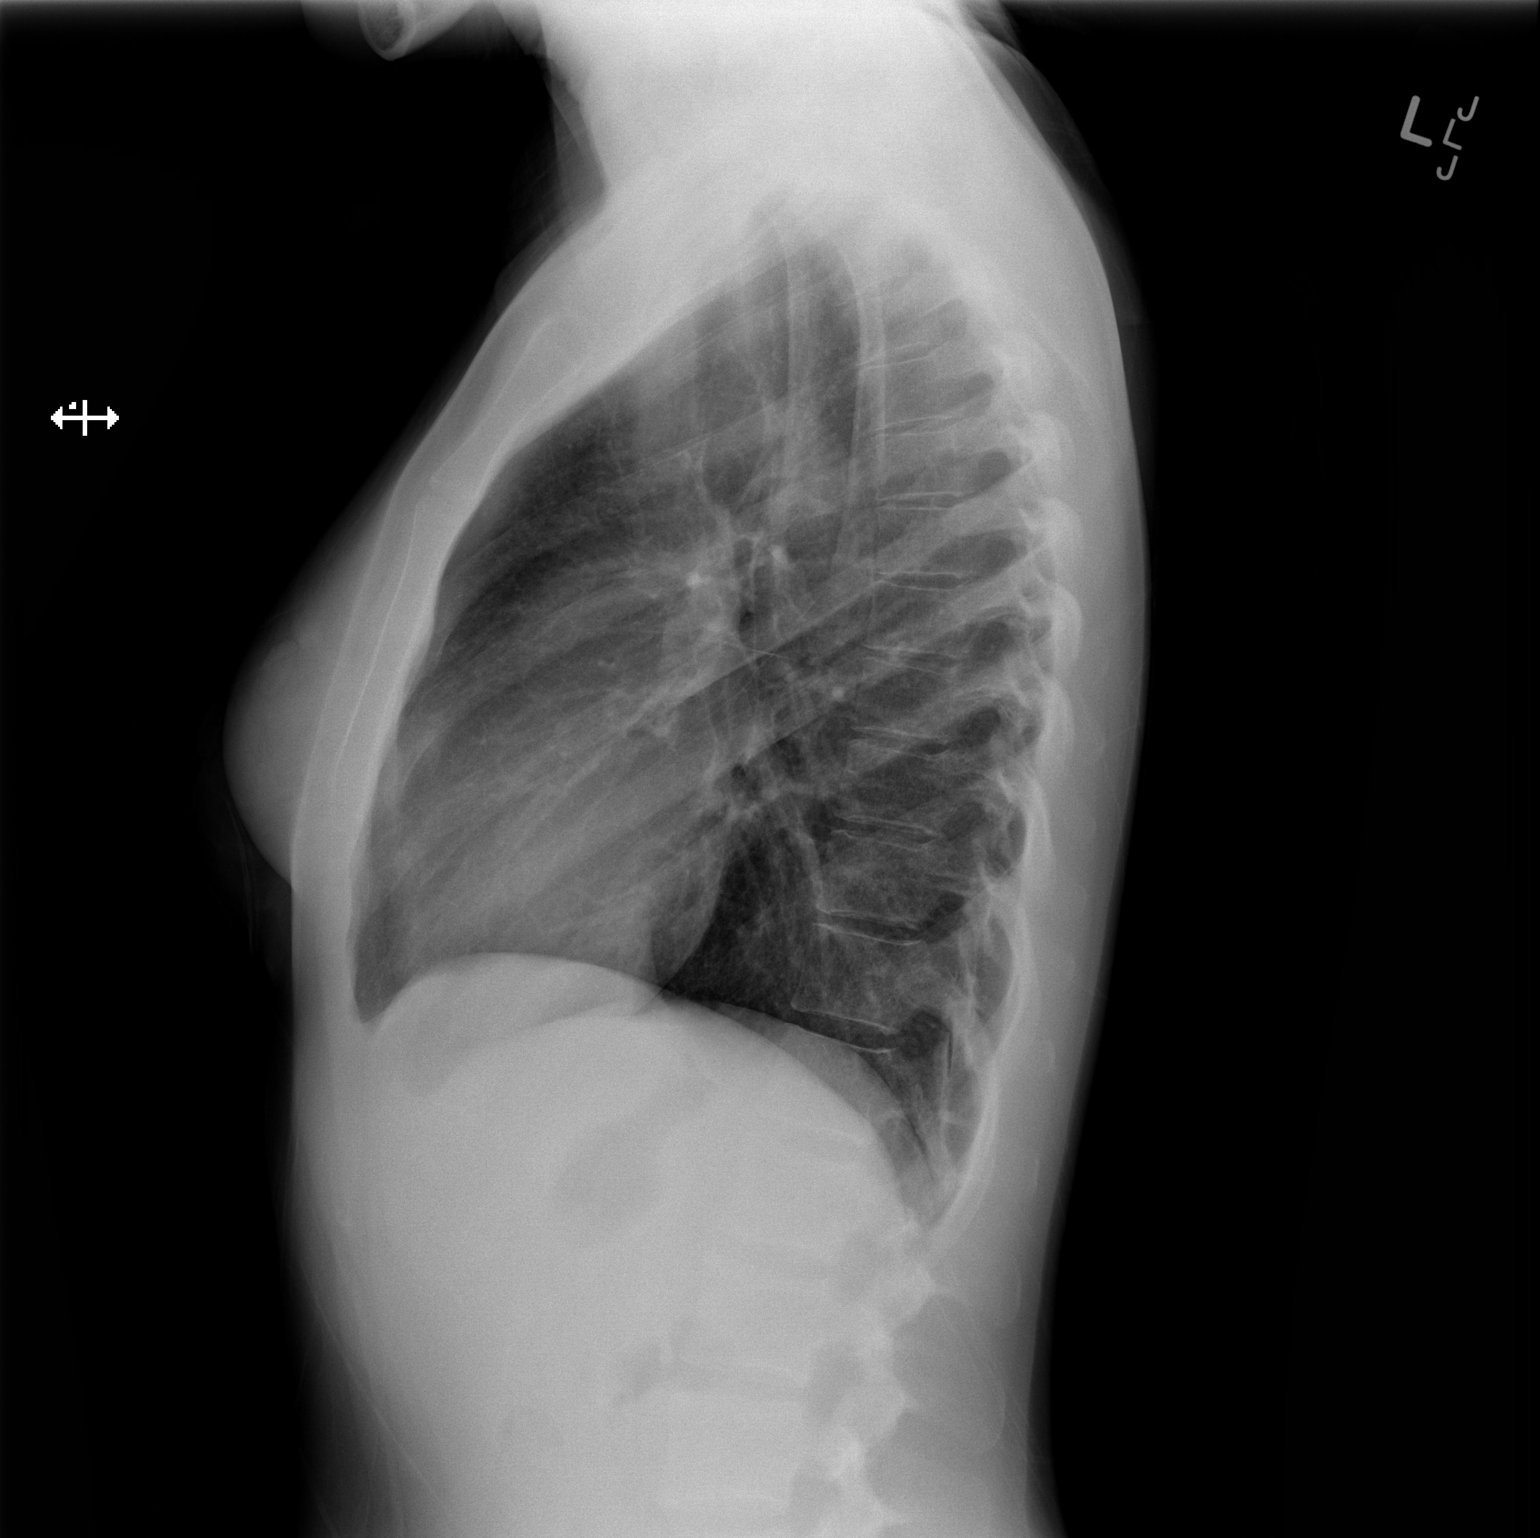

[2 of 2 positions shown; findings below may reference images not displayed]

FINDINGS: Normal sized heart. Clear lungs. Minimal scoliosis.
IMPRESSION: No acute abnormality.

## 2013-11-07 ENCOUNTER — Emergency Department (HOSPITAL_COMMUNITY)

## 2013-11-07 ENCOUNTER — Encounter (HOSPITAL_COMMUNITY): Payer: Self-pay | Admitting: Emergency Medicine

## 2013-11-07 ENCOUNTER — Emergency Department (HOSPITAL_COMMUNITY)
Admission: EM | Admit: 2013-11-07 | Discharge: 2013-11-07 | Disposition: A | Discharge status: Home / Self Care | Attending: Emergency Medicine | Admitting: Emergency Medicine

## 2013-11-07 DIAGNOSIS — Z3202 Encounter for pregnancy test, result negative: Secondary | ICD-10-CM | POA: Insufficient documentation

## 2013-11-07 DIAGNOSIS — G8929 Other chronic pain: Secondary | ICD-10-CM | POA: Insufficient documentation

## 2013-11-07 DIAGNOSIS — R109 Unspecified abdominal pain: Secondary | ICD-10-CM | POA: Insufficient documentation

## 2013-11-07 DIAGNOSIS — Z87442 Personal history of urinary calculi: Secondary | ICD-10-CM | POA: Insufficient documentation

## 2013-11-07 DIAGNOSIS — Z79899 Other long term (current) drug therapy: Secondary | ICD-10-CM | POA: Insufficient documentation

## 2013-11-07 DIAGNOSIS — F17200 Nicotine dependence, unspecified, uncomplicated: Secondary | ICD-10-CM | POA: Insufficient documentation

## 2013-11-07 DIAGNOSIS — Z862 Personal history of diseases of the blood and blood-forming organs and certain disorders involving the immune mechanism: Secondary | ICD-10-CM | POA: Insufficient documentation

## 2013-11-07 DIAGNOSIS — Z880 Allergy status to penicillin: Secondary | ICD-10-CM | POA: Insufficient documentation

## 2013-11-07 DIAGNOSIS — R112 Nausea with vomiting, unspecified: Secondary | ICD-10-CM | POA: Insufficient documentation

## 2013-11-07 LAB — URINALYSIS, ROUTINE W REFLEX MICROSCOPIC
Bilirubin Urine: NEGATIVE
Glucose, UA: NEGATIVE mg/dL
Hgb urine dipstick: NEGATIVE
KETONES UR: NEGATIVE mg/dL
LEUKOCYTES UA: NEGATIVE
Nitrite: NEGATIVE
Protein, ur: NEGATIVE mg/dL
Specific Gravity, Urine: 1.008 (ref 1.005–1.030)
UROBILINOGEN UA: 0.2 mg/dL (ref 0.0–1.0)
pH: 6 (ref 5.0–8.0)

## 2013-11-07 LAB — I-STAT CHEM 8, ED
BUN: 15 mg/dL (ref 6–23)
BUN: 16 mg/dL (ref 6–23)
CALCIUM ION: 1.12 mmol/L (ref 1.12–1.23)
Calcium, Ion: 1.11 mmol/L — ABNORMAL LOW (ref 1.12–1.23)
Chloride: 111 mEq/L (ref 96–112)
Chloride: 112 mEq/L (ref 96–112)
Creatinine, Ser: 0.9 mg/dL (ref 0.50–1.10)
Creatinine, Ser: 0.9 mg/dL (ref 0.50–1.10)
Glucose, Bld: 79 mg/dL (ref 70–99)
Glucose, Bld: 80 mg/dL (ref 70–99)
HCT: 40 % (ref 36.0–46.0)
HEMATOCRIT: 41 % (ref 36.0–46.0)
HEMOGLOBIN: 13.6 g/dL (ref 12.0–15.0)
HEMOGLOBIN: 13.9 g/dL (ref 12.0–15.0)
Potassium: 4.4 mEq/L (ref 3.7–5.3)
Potassium: 4.6 mEq/L (ref 3.7–5.3)
SODIUM: 141 meq/L (ref 137–147)
SODIUM: 141 meq/L (ref 137–147)
TCO2: 18 mmol/L (ref 0–100)
TCO2: 19 mmol/L (ref 0–100)

## 2013-11-07 LAB — PREGNANCY, URINE: PREG TEST UR: NEGATIVE

## 2013-11-07 MED ORDER — ONDANSETRON HCL 8 MG PO TABS: 8.0000 mg | ORAL_TABLET | Freq: Three times a day (TID) | ORAL | Status: DC | PRN

## 2013-11-07 MED ORDER — ONDANSETRON HCL 4 MG/2ML IJ SOLN
4.0000 mg | Freq: Once | INTRAMUSCULAR | Status: AC
  Administered 2013-11-07: 4 mg via INTRAVENOUS
  Filled 2013-11-07: qty 2

## 2013-11-07 MED ORDER — HYDROMORPHONE HCL PF 1 MG/ML IJ SOLN
1.0000 mg | Freq: Once | INTRAMUSCULAR | Status: AC
  Administered 2013-11-07: 1 mg via INTRAVENOUS
  Filled 2013-11-07: qty 1

## 2013-11-07 MED ORDER — HYDROCODONE-ACETAMINOPHEN 5-325 MG PO TABS: 1.0000 | ORAL_TABLET | Freq: Four times a day (QID) | ORAL | Status: DC | PRN

## 2013-11-07 NOTE — ED Notes (Signed)
Pt escorted to discharge window. Pt verbalized understanding discharge instructions. In no acute distress.  

## 2013-11-07 NOTE — ED Provider Notes (Signed)
CSN: [id number]     Arrival date & time 11/07/13  4098 History   First MD Initiated Contact with Patient 11/07/13 [phone]     No chief complaint on file.    (Consider location/radiation/quality/duration/timing/severity/associated sxs/prior Treatment) HPI Complains of left-side flank pain onset 8 PM yesterday feels like kidney stone she's had in the past. Pain is nonradiating sharp and severe. Nothing makes symptoms better or worse. Associated symptoms include vomiting twice since onset of pain and nausea. Last bowel movement. 10/24/2013. No other associated symptoms. Past Medical History  Diagnosis Date  . Kidney stones   . Anemia   . Chronic headaches    Past Surgical History  Procedure Laterality Date  . Stents in kidneys     Family History  Problem Relation Age of Onset  . Breast cancer Maternal Grandmother   . Breast cancer Maternal Aunt   . Breast cancer Maternal Grandmother     great grand   History  Substance Use Topics  . Smoking status: Current Every Day Smoker -- 0.25 packs/day    Types: Cigarettes  . Smokeless tobacco: Never Used  . Alcohol Use: No   OB History   Grav Para Term Preterm Abortions TAB SAB Ect Mult Living                 Review of Systems  Constitutional: Negative.   HENT: Negative.   Respiratory: Negative.   Cardiovascular: Negative.   Gastrointestinal: Positive for nausea and vomiting.  Genitourinary: Positive for flank pain.  Musculoskeletal: Negative.   Skin: Negative.   Neurological: Negative.   Psychiatric/Behavioral: Negative.   All other systems reviewed and are negative.     Allergies  Biaxin; Penicillins; and Tramadol  Home Medications   Prior to Admission medications   Medication Sig Start Date End Date Taking? Authorizing Provider  Aspirin-Acetaminophen-Caffeine (GOODY HEADACHE PO) Take 1 packet by mouth as needed (for pain or headache).   Yes Historical Provider, MD  pantoprazole (PROTONIX) 40 MG tablet Take 40 mg by  mouth daily.   Yes Historical Provider, MD   BP 127/67  Pulse 106  Temp(Src) 98.1 F (36.7 C) (Oral)  Resp 20  SpO2 96% Physical Exam  Nursing note and vitals reviewed. Constitutional: She appears well-developed and well-nourished. She appears distressed.  Appears uncomfortable pacing back and forth in the room  HENT:  Head: Normocephalic and atraumatic.  Eyes: Conjunctivae are normal. Pupils are equal, round, and reactive to light.  Neck: Neck supple. No tracheal deviation present. No thyromegaly present.  Cardiovascular: Normal rate and regular rhythm.   No murmur heard. Pulmonary/Chest: Effort normal and breath sounds normal.  Abdominal: Soft. Bowel sounds are normal. She exhibits no distension. There is no tenderness.  Genitourinary:  No flank tenderness  Musculoskeletal: Normal range of motion. She exhibits no edema and no tenderness.  Neurological: She is alert. Coordination normal.  Skin: Skin is warm and dry. No rash noted.  Psychiatric: She has a normal mood and affect.    ED Course  Procedures (including critical care time) Labs Review Labs Reviewed - No data to display  Imaging Review No results found.   EKG Interpretation None     10:30 AM patient's pain is improved and she feels ready to go home. Results for orders placed during the hospital encounter of 11/07/13  URINALYSIS, ROUTINE W REFLEX MICROSCOPIC      Result Value Ref Range   Color, Urine YELLOW  YELLOW   APPearance CLEAR  CLEAR  Specific Gravity, Urine 1.008  1.005 - 1.030   pH 6.0  5.0 - 8.0   Glucose, UA NEGATIVE  NEGATIVE mg/dL   Hgb urine dipstick NEGATIVE  NEGATIVE   Bilirubin Urine NEGATIVE  NEGATIVE   Ketones, ur NEGATIVE  NEGATIVE mg/dL   Protein, ur NEGATIVE  NEGATIVE mg/dL   Urobilinogen, UA 0.2  0.0 - 1.0 mg/dL   Nitrite NEGATIVE  NEGATIVE   Leukocytes, UA NEGATIVE  NEGATIVE  PREGNANCY, URINE      Result Value Ref Range   Preg Test, Ur NEGATIVE  NEGATIVE  I-STAT CHEM 8, ED       Result Value Ref Range   Sodium 141  137 - 147 mEq/L   Potassium 4.4  3.7 - 5.3 mEq/L   Chloride 111  96 - 112 mEq/L   BUN 15  6 - 23 mg/dL   Creatinine, Ser 1.610.90  0.50 - 1.10 mg/dL   Glucose, Bld 80  70 - 99 mg/dL   Calcium, Ion 0.961.12  0.451.12 - 1.23 mmol/L   TCO2 18  0 - 100 mmol/L   Hemoglobin 13.9  12.0 - 15.0 g/dL   HCT 40.941.0  81.136.0 - 91.446.0 %  I-STAT CHEM 8, ED      Result Value Ref Range   Sodium 141  137 - 147 mEq/L   Potassium 4.6  3.7 - 5.3 mEq/L   Chloride 112  96 - 112 mEq/L   BUN 16  6 - 23 mg/dL   Creatinine, Ser 7.820.90  0.50 - 1.10 mg/dL   Glucose, Bld 79  70 - 99 mg/dL   Calcium, Ion 9.561.11 (*) 1.12 - 1.23 mmol/L   TCO2 19  0 - 100 mmol/L   Hemoglobin 13.6  12.0 - 15.0 g/dL   HCT 21.340.0  08.636.0 - 57.846.0 %   Ct Abdomen Pelvis Wo Contrast  11/07/2013   CLINICAL DATA:  Right flank pain since 9 p.m. yesterday  EXAM: CT ABDOMEN AND PELVIS WITHOUT CONTRAST  TECHNIQUE: Multidetector CT imaging of the abdomen and pelvis was performed following the standard protocol without intravenous contrast.  COMPARISON:  NM HEPATO W/GB/PHARM/QUAN MEASUR dated 05/23/2013; CT ABD/PELVIS W CM dated 01/24/2013  FINDINGS: The lung bases are clear.  There are bilateral punctate nonobstructing renal calculi. There is mild right hydroureteronephrosis without a ureteral calculus. No perinephric stranding is seen. The kidneys are symmetric in size without evidence for exophytic mass. The bladder is unremarkable.  The liver demonstrates no focal abnormality. The gallbladder is unremarkable. The spleen demonstrates no focal abnormality. The adrenal glands and pancreas are normal.  The unopacified stomach, duodenum, small intestine and large intestine are unremarkable, but evaluation is limited by lack of oral contrast. There is a normal caliber appendix in the right lower quadrant without periappendiceal inflammatory changes. There is no pneumoperitoneum, pneumatosis, or portal venous gas. There is no abdominal or pelvic  free fluid. There is no lymphadenopathy. The uterus and ovaries are normal.  The abdominal aorta is normal in caliber.  The osseous structures are unremarkable.  IMPRESSION: 1. Bilateral nonobstructing punctate renal calculi. 2. Mild right hydroureteronephrosis without a ureteral calculus. This may reflect recently passed calculus versus radiolucent calculus versus infection. Correlate with urinalysis.   Electronically Signed   By: Elige KoHetal  Patel   On: 11/07/2013 08:50    MDM   Final diagnoses:  None   CT scan report reviewed by me. Mild hydronephrosis seen on right side does not correlate clinically is patient's pain is on  left side. Plan prescription Norco, Zofran. Follow up with [facility], primary care physician if significant pain you in 2 days Diagnosis left flank pain      Doug SouSam Epic Tribbett, MD 11/07/13 1034

## 2013-11-07 NOTE — ED Notes (Signed)
Per pt, flank pain starting on left side since 9 pm yesterday.  N/v.  No fever.  No diarrhea.  Hx kidney stone and pt states feels the same.

## 2013-11-07 NOTE — Discharge Instructions (Signed)
He can continue to take Aleve or Tylenol for mild pain, or take the pain medication prescribed for bad pain. You have also been prescribed medicine for nausea. Contact your primary care physician if you continue to have significant pain in one or 2 days. Return if concerned for any reason

## 2014-01-08 ENCOUNTER — Emergency Department (HOSPITAL_COMMUNITY)

## 2014-01-08 ENCOUNTER — Emergency Department (HOSPITAL_COMMUNITY)
Admission: EM | Admit: 2014-01-08 | Discharge: 2014-01-08 | Disposition: A | Discharge status: Home / Self Care | Attending: Emergency Medicine | Admitting: Emergency Medicine

## 2014-01-08 ENCOUNTER — Encounter (HOSPITAL_COMMUNITY): Payer: Self-pay | Admitting: Emergency Medicine

## 2014-01-08 DIAGNOSIS — Z79899 Other long term (current) drug therapy: Secondary | ICD-10-CM | POA: Insufficient documentation

## 2014-01-08 DIAGNOSIS — S9030XA Contusion of unspecified foot, initial encounter: Secondary | ICD-10-CM | POA: Insufficient documentation

## 2014-01-08 DIAGNOSIS — F17200 Nicotine dependence, unspecified, uncomplicated: Secondary | ICD-10-CM | POA: Insufficient documentation

## 2014-01-08 DIAGNOSIS — Z791 Long term (current) use of non-steroidal anti-inflammatories (NSAID): Secondary | ICD-10-CM | POA: Insufficient documentation

## 2014-01-08 DIAGNOSIS — Y9389 Activity, other specified: Secondary | ICD-10-CM | POA: Insufficient documentation

## 2014-01-08 DIAGNOSIS — G8929 Other chronic pain: Secondary | ICD-10-CM | POA: Insufficient documentation

## 2014-01-08 DIAGNOSIS — Z880 Allergy status to penicillin: Secondary | ICD-10-CM | POA: Insufficient documentation

## 2014-01-08 DIAGNOSIS — Z862 Personal history of diseases of the blood and blood-forming organs and certain disorders involving the immune mechanism: Secondary | ICD-10-CM | POA: Insufficient documentation

## 2014-01-08 DIAGNOSIS — S9032XA Contusion of left foot, initial encounter: Secondary | ICD-10-CM

## 2014-01-08 DIAGNOSIS — Z87442 Personal history of urinary calculi: Secondary | ICD-10-CM | POA: Insufficient documentation

## 2014-01-08 DIAGNOSIS — Y9289 Other specified places as the place of occurrence of the external cause: Secondary | ICD-10-CM | POA: Insufficient documentation

## 2014-01-08 DIAGNOSIS — IMO0002 Reserved for concepts with insufficient information to code with codable children: Secondary | ICD-10-CM | POA: Insufficient documentation

## 2014-01-08 MED ORDER — IBUPROFEN 800 MG PO TABS: 800.0000 mg | ORAL_TABLET | Freq: Three times a day (TID) | ORAL | Status: DC

## 2014-01-08 NOTE — ED Notes (Signed)
Per pt, slammed left foot in car door-bruising to outer foot, cant move little toe

## 2014-01-08 NOTE — Progress Notes (Signed)
Orthopedic Tech Progress Note Patient Details:  Christina BostonHeather Stone 1981/01/27 [id number] Applied ACE bandage to Stone foot & ankle.  Pulses, sensation, motion intact before and after application.  Capillary refill less than 2 seconds before and after application. Ortho Devices Type of Ortho Device: Ace wrap Ortho Device/Splint Location: LLE Ortho Device/Splint Interventions: Application   Christina Stone, Christina Stone 01/08/2014, 10:50 AM

## 2014-01-08 NOTE — Discharge Instructions (Signed)
Take ibuprofen as directed as needed for pain. Rest, ice and elevate your foot.  Foot Contusion A foot contusion is a deep bruise to the foot. Contusions are the result of an injury that caused bleeding under the skin. The contusion may turn blue, purple, or yellow. Minor injuries will give you a painless contusion, but more severe contusions may stay painful and swollen for a few weeks. CAUSES  A foot contusion comes from a direct blow to that area, such as a heavy object falling on the foot. SYMPTOMS   Swelling of the foot.  Discoloration of the foot.  Tenderness or soreness of the foot. DIAGNOSIS  You will have a physical exam and will be asked about your history. You may need an X-ray of your foot to look for a broken bone (fracture).  TREATMENT  An elastic wrap may be recommended to support your foot. Resting, elevating, and applying cold compresses to your foot are often the best treatments for a foot contusion. Over-the-counter medicines may also be recommended for pain control. HOME CARE INSTRUCTIONS   Put ice on the injured area.  Put ice in a plastic bag.  Place a towel between your skin and the bag.  Leave the ice on for 15-20 minutes, 03-04 times a day.  Only take over-the-counter or prescription medicines for pain, discomfort, or fever as directed by your caregiver.  If told, use an elastic wrap as directed. This can help reduce swelling. You may remove the wrap for sleeping, showering, and bathing. If your toes become numb, cold, or blue, take the wrap off and reapply it more loosely.  Elevate your foot with pillows to reduce swelling.  Try to avoid standing or walking while the foot is painful. Do not resume use until instructed by your caregiver. Then, begin use gradually. If pain develops, decrease use. Gradually increase activities that do not cause discomfort until you have normal use of your foot.  See your caregiver as directed. It is very important to keep all  follow-up appointments in order to avoid any lasting problems with your foot, including long-term (chronic) pain. SEEK IMMEDIATE MEDICAL CARE IF:   You have increased redness, swelling, or pain in your foot.  Your swelling or pain is not relieved with medicines.  You have loss of feeling in your foot or are unable to move your toes.  Your foot turns cold or blue.  You have pain when you move your toes.  Your foot becomes warm to the touch.  Your contusion does not improve in 2 days. MAKE SURE YOU:   Understand these instructions.  Will watch your condition.  Will get help right away if you are not doing well or get worse. Document Released: 04/19/2006 Document Revised: 12/28/2011 Document Reviewed: 06/01/2011 [facility] Patient Information 2015 [employer] [place]. This information is not intended to replace advice given to you by your health care provider. Make sure you discuss any questions you have with your health care provider.

## 2014-01-08 NOTE — ED Provider Notes (Signed)
CSN: [id number]     Arrival date & time 01/08/14  [id number] History   First MD Initiated Contact with Patient 01/08/14 1006     Chief Complaint  Patient presents with  . Foot Injury     (Consider location/radiation/quality/duration/timing/severity/associated sxs/prior Treatment) HPI Comments: 33 year old female presents to the emergency department complaining of left foot pain after "slamming it" in her car door around 6:30 AM today. States her foot immediately started to bruise and swell, has severe, constant pain, worse with pressure, unrelieved by Aleve. States her foot is slightly numb on the outside.  Patient is a 33 y.o. female presenting with foot injury. The history is provided by the patient.  Foot Injury   Past Medical History  Diagnosis Date  . Kidney stones   . Anemia   . Chronic headaches    Past Surgical History  Procedure Laterality Date  . Stents in kidneys     Family History  Problem Relation Age of Onset  . Breast cancer Maternal Grandmother   . Breast cancer Maternal Aunt   . Breast cancer Maternal Grandmother     great grand   History  Substance Use Topics  . Smoking status: Current Every Day Smoker -- 0.25 packs/day    Types: Cigarettes  . Smokeless tobacco: Never Used  . Alcohol Use: No   OB History   Grav Para Term Preterm Abortions TAB SAB Ect Mult Living                 Review of Systems  Constitutional: Negative.   HENT: Negative.   Musculoskeletal:       Positive for left foot pain.  Skin: Positive for color change.  Neurological: Positive for numbness.      Allergies  Biaxin; Penicillins; and Tramadol  Home Medications   Prior to Admission medications   Medication Sig Start Date End Date Taking? Authorizing Provider  Aspirin-Acetaminophen-Caffeine (GOODY HEADACHE PO) Take 1 packet by mouth as needed (for pain or headache).    Historical Provider, MD  HYDROcodone-acetaminophen (NORCO) 5-325 MG per tablet Take 1-2 tablets by mouth  every 6 (six) hours as needed for severe pain. 11/07/13   Doug SouSam Jacubowitz, MD  ibuprofen (ADVIL,MOTRIN) 800 MG tablet Take 1 tablet (800 mg total) by mouth 3 (three) times daily. 01/08/14   Trevor Maceobyn M Albert, PA-C  ondansetron (ZOFRAN) 8 MG tablet Take 1 tablet (8 mg total) by mouth every 8 (eight) hours as needed for nausea. 11/07/13   Doug SouSam Jacubowitz, MD  pantoprazole (PROTONIX) 40 MG tablet Take 40 mg by mouth daily.    Historical Provider, MD   BP 130/74  Pulse 86  Temp(Src) 98.3 F (36.8 C) (Oral)  Resp 18  SpO2 100%  LMP 01/04/2014 Physical Exam  Nursing note and vitals reviewed. Constitutional: She is oriented to person, place, and time. She appears well-developed and well-nourished. No distress.  HENT:  Head: Normocephalic and atraumatic.  Mouth/Throat: Oropharynx is clear and moist.  Eyes: Conjunctivae and EOM are normal.  Neck: Normal range of motion. Neck supple.  Cardiovascular: Normal rate, regular rhythm and normal heart sounds.   +2 PT/DP pulse on left.  Pulmonary/Chest: Effort normal and breath sounds normal. No respiratory distress.  Musculoskeletal:       Feet:  Full range of motion left ankle. Pain in the left foot with passive range of motion of fourth and fifth toe. Cap refill < 3 seconds.  Neurological: She is alert and oriented to person, place, and time. No  sensory deficit.  Skin: Skin is warm and dry.  Psychiatric: She has a normal mood and affect. Her behavior is normal.    ED Course  Procedures (including critical care time) Labs Review Labs Reviewed - No data to display  Imaging Review Dg Foot Complete Left  01/08/2014   CLINICAL DATA:  Hematoma, injury  EXAM: LEFT FOOT - COMPLETE 3+ VIEW  COMPARISON:  None.  FINDINGS: Three views of the left foot submitted. No acute fracture or subluxation. No radiopaque foreign body.  IMPRESSION: Negative.   Electronically Signed   By: Natasha MeadLiviu  Pop M.D.   On: 01/08/2014 10:06     EKG Interpretation None      MDM    Final diagnoses:  Foot contusion, left, initial encounter   Patient presenting with left foot contusion after slamming it in her car door. Neurovascularly intact. X-ray without any acute finding. Ace wrap applied. Advised RICE, NSAIDs. Stable for discharge. Return precautions given. Patient states understanding of treatment care plan and is agreeable.   Trevor MaceRobyn M Albert, PA-C 01/08/14 1030

## 2014-01-09 ENCOUNTER — Telehealth: Payer: Self-pay | Admitting: Internal Medicine

## 2014-01-09 NOTE — Telephone Encounter (Signed)
Spoke to the patient-she reports she was taking her Protonix BID until 3 months ago-these past 2 weeks she has been experiencing mid epigastric pain and vomiting-she feels nauseated, vomits and then feels better-she has tried Maalox with minimal relief-she took Zofran with relief-she denies fever, pregnancy or new medications-I have made her an appt with Shanda BumpsJessica 01/15/14-is there anything you would advise in the meanwhile?

## 2014-01-10 NOTE — ED Provider Notes (Signed)
Medical screening examination/treatment/procedure(s) were performed by non-physician practitioner and as supervising physician I was immediately available for consultation/collaboration.     Suzi RootsKevin E Steinl, MD 01/10/14 [phone]

## 2014-01-14 ENCOUNTER — Other Ambulatory Visit: Payer: Self-pay

## 2014-01-14 DIAGNOSIS — R109 Unspecified abdominal pain: Secondary | ICD-10-CM

## 2014-01-14 MED ORDER — PANTOPRAZOLE SODIUM 40 MG PO TBEC: 40.0000 mg | DELAYED_RELEASE_TABLET | Freq: Two times a day (BID) | ORAL | Status: DC

## 2014-01-14 NOTE — Telephone Encounter (Signed)
Increase pantoprazole back to 40 mg twice daily Zofran for nausea Office visit scheduled for tomorrow Labs before visit, CBC, CMP, amylase and lipase

## 2014-01-14 NOTE — Telephone Encounter (Signed)
Spoke with the patient-her worked required that she reschedule her appointment-she is now coming in next week-she will get her labs [facility] at [facility]

## 2014-01-15 ENCOUNTER — Ambulatory Visit: Admitting: Gastroenterology

## 2014-01-17 ENCOUNTER — Other Ambulatory Visit (INDEPENDENT_AMBULATORY_CARE_PROVIDER_SITE_OTHER)

## 2014-01-17 DIAGNOSIS — R109 Unspecified abdominal pain: Secondary | ICD-10-CM

## 2014-01-17 LAB — CBC WITH DIFFERENTIAL/PLATELET
BASOS ABS: 0.1 10*3/uL (ref 0.0–0.1)
Basophils Relative: 0.6 % (ref 0.0–3.0)
EOS PCT: 0.6 % (ref 0.0–5.0)
Eosinophils Absolute: 0.1 10*3/uL (ref 0.0–0.7)
HEMATOCRIT: 38.2 % (ref 36.0–46.0)
Hemoglobin: 12.7 g/dL (ref 12.0–15.0)
LYMPHS ABS: 2.9 10*3/uL (ref 0.7–4.0)
Lymphocytes Relative: 28.9 % (ref 12.0–46.0)
MCHC: 33.2 g/dL (ref 30.0–36.0)
MCV: 88.5 fl (ref 78.0–100.0)
MONO ABS: 0.5 10*3/uL (ref 0.1–1.0)
Monocytes Relative: 5.1 % (ref 3.0–12.0)
Neutro Abs: 6.5 10*3/uL (ref 1.4–7.7)
Neutrophils Relative %: 64.8 % (ref 43.0–77.0)
Platelets: 274 10*3/uL (ref 150.0–400.0)
RBC: 4.32 Mil/uL (ref 3.87–5.11)
RDW: 14.2 % (ref 11.5–15.5)
WBC: 10 10*3/uL (ref 4.0–10.5)

## 2014-01-17 LAB — AMYLASE: Amylase: 70 U/L (ref 27–131)

## 2014-01-17 LAB — COMPREHENSIVE METABOLIC PANEL
ALBUMIN: 4.2 g/dL (ref 3.5–5.2)
ALK PHOS: 41 U/L (ref 39–117)
ALT: 15 U/L (ref 0–35)
AST: 21 U/L (ref 0–37)
BILIRUBIN TOTAL: 0.4 mg/dL (ref 0.2–1.2)
BUN: 19 mg/dL (ref 6–23)
CO2: 23 mEq/L (ref 19–32)
Calcium: 9.6 mg/dL (ref 8.4–10.5)
Chloride: 104 mEq/L (ref 96–112)
Creatinine, Ser: 0.8 mg/dL (ref 0.4–1.2)
GFR: 90.3 mL/min (ref >60.00)
Glucose, Bld: 87 mg/dL (ref 70–99)
POTASSIUM: 4.4 meq/L (ref 3.5–5.1)
Sodium: 136 mEq/L (ref 135–145)
TOTAL PROTEIN: 7.4 g/dL (ref 6.0–8.3)

## 2014-01-17 LAB — LIPASE: Lipase: 42 U/L (ref 11.0–59.0)

## 2014-01-24 ENCOUNTER — Encounter: Payer: Self-pay | Admitting: Nurse Practitioner

## 2014-01-24 ENCOUNTER — Ambulatory Visit (INDEPENDENT_AMBULATORY_CARE_PROVIDER_SITE_OTHER): Admitting: Nurse Practitioner

## 2014-01-24 VITALS — BP 112/68 | HR 68 | Ht 65.0 in | Wt 140.8 lb

## 2014-01-24 DIAGNOSIS — R110 Nausea: Secondary | ICD-10-CM

## 2014-01-24 DIAGNOSIS — R1013 Epigastric pain: Secondary | ICD-10-CM

## 2014-01-24 DIAGNOSIS — R1084 Generalized abdominal pain: Secondary | ICD-10-CM

## 2014-01-24 MED ORDER — HYDROCODONE-ACETAMINOPHEN 5-325 MG PO TABS: 1.0000 | ORAL_TABLET | Freq: Four times a day (QID) | ORAL | Status: DC | PRN

## 2014-01-24 NOTE — Patient Instructions (Signed)
You have been scheduled for an endoscopy with Dr. Rhea BeltonPyrtle. Please follow written instructions given to you at your visit today. If you use inhalers (even only as needed), please bring them with you on the day of your procedure. Your physician has requested that you go to www.startemmi.com and enter the access code given to you at your visit today. This web site gives a general overview about your procedure. However, you should still follow specific instructions given to you by our office regarding your preparation for the procedure.

## 2014-01-24 NOTE — Progress Notes (Addendum)
     History of Present Illness:  Patient is a 33 yo female evaluated by Dr Rhea BeltonPyrtle October 2014 for epigastric pain, n/v. She had been seen in ED where CT scan  raised question of dilated bile ducts.  Subsequent workup included a  normal MRCP and normal. HIDA with [facility]   Patient comes in today for a three week history of upper abdominal pain and intermittent nausea.  This pain is different than what she was evaluated for in November 2014. The pain wakes her up in the early morning hours, lasts 3-4 hours at a time. The pain is is diffuse across the upper abdomen and radiates around both sides. Patient has a history of kidney stones. She was in ED late April with left flank pain. CTscan without contrast showed bilateral nonobstructing renal calculi and mild right hydroureteronephrosis without obvious stone. Patient tells me her current symptoms are different than kidney stone pain. We spoke to her over the phone 01/14/14 and increased PPI to BID. She has also tried Maalox and heating pad without relief. She is exhausted, has gained 15 pounds over the last month. Bowels are normal. She had a CBC and CMET 01/17/14 which were normal. Of note, patient takes Marlin CanaryGoody Powders several times a week.    Current Medications, Allergies, Past Medical History, Past Surgical History, Family History and Social History were [facility].  Physical Exam: General: Pleasant, well developed , white female in no acute distress Head: Normocephalic and atraumatic Eyes:  sclerae anicteric, conjunctiva pink  Ears: Normal auditory acuity Lungs: Clear throughout to auscultation Heart: Regular rate and rhythm Abdomen: Soft, non distended, mild epigastric tenderness. No masses, no hepatomegaly. Normal bowel sounds Musculoskeletal: Symmetrical with no gross deformities  Extremities: No edema  Neurological: Alert oriented x 4, grossly nonfocal Psychological:  Alert and cooperative. Normal mood  and affect  Assessment and Recommendations:   33 year old female with 3 week history of diffuse upper abdominal pain radiating around both upper sides and associated with nausea. Recent CBC, lipase, CMET normal. Of note she had a normal MRCP and HIDA with [facility] in November 2014 for abdominal pain. Patient has been taking Goody Powders several times a week so it is reasonable to pursue EGD to exclude PUD. The benefits, risks, and potential complications of EGD with possible biopsies were discussed with the patient and she agrees to proceed.  Continue BID PPI. Will given her some pain medication to take for severe pain.  She has Zofran at home to take as needed   Addendum: Reviewed and agree with initial management. Beverley FiedlerJay M Pyrtle, MD

## 2014-01-25 ENCOUNTER — Encounter: Payer: Self-pay | Admitting: Nurse Practitioner

## 2014-01-25 DIAGNOSIS — R109 Unspecified abdominal pain: Secondary | ICD-10-CM | POA: Insufficient documentation

## 2014-01-25 DIAGNOSIS — R110 Nausea: Secondary | ICD-10-CM | POA: Insufficient documentation

## 2014-02-14 ENCOUNTER — Other Ambulatory Visit: Payer: Self-pay | Admitting: Nurse Practitioner

## 2014-02-18 ENCOUNTER — Telehealth: Payer: Self-pay | Admitting: Internal Medicine

## 2014-02-18 ENCOUNTER — Other Ambulatory Visit: Payer: Self-pay

## 2014-02-18 MED ORDER — HYDROCODONE-ACETAMINOPHEN 5-325 MG PO TABS: 1.0000 | ORAL_TABLET | Freq: Four times a day (QID) | ORAL | Status: DC | PRN

## 2014-02-18 NOTE — Telephone Encounter (Signed)
Patient is going out of town tomorrow morning and would like Norco refilled to take with her. It was last filled 7/16/ for #30. Her EGD is //. Please advise.

## 2014-02-18 NOTE — Telephone Encounter (Signed)
Patient will pick this up after 4 PM

## 2014-02-18 NOTE — Telephone Encounter (Signed)
I reviewed her chart.  Looks like she has been getting periodic narcotic refills for many months.   OK to refill at same dose, sig as previous. Thanks

## 2014-02-27 ENCOUNTER — Encounter: Payer: Self-pay | Admitting: Internal Medicine

## 2014-02-27 ENCOUNTER — Ambulatory Visit (AMBULATORY_SURGERY_CENTER): Admitting: Internal Medicine

## 2014-02-27 VITALS — BP 116/74 | HR 73 | Temp 98.0°F | Resp 16 | Ht 65.0 in | Wt 140.0 lb

## 2014-02-27 DIAGNOSIS — R1012 Left upper quadrant pain: Secondary | ICD-10-CM

## 2014-02-27 DIAGNOSIS — R1013 Epigastric pain: Secondary | ICD-10-CM

## 2014-02-27 DIAGNOSIS — K2960 Other gastritis without bleeding: Secondary | ICD-10-CM

## 2014-02-27 MED ORDER — SODIUM CHLORIDE 0.9 % IV SOLN: 500.0000 mL | INTRAVENOUS | Status: DC

## 2014-02-27 NOTE — Patient Instructions (Signed)
Gastritis seen today, handout given. Biopsies taken, await results. Avoid NSAIDS!! Call us with any questions or concerns. Thank you!  YOU HAD AN ENDOSCOPIC PROCEDURE TODAY AT THE [facility]: Refer to the procedure report that was given to you for any specific questions about what was found during the examination.  If the procedure report does not answer your questions, please call your gastroenterologist to clarify.  If you requested that your care partner not be given the details of your procedure findings, then the procedure report has been included in a sealed envelope for you to review at your convenience later.  YOU SHOULD EXPECT: Some feelings of bloating in the abdomen. Passage of more gas than usual.  Walking can help get rid of the air that was put into your GI tract during the procedure and reduce the bloating. If you had a lower endoscopy (such as a colonoscopy or flexible sigmoidoscopy) you may notice spotting of blood in your stool or on the toilet paper. If you underwent a bowel prep for your procedure, then you may not have a normal bowel movement for a few days.  DIET: Your first meal following the procedure should be a light meal and then it is ok to progress to your normal diet.  A half-sandwich or bowl of soup is an example of a good first meal.  Heavy or fried foods are harder to digest and may make you feel nauseous or bloated.  Likewise meals heavy in dairy and vegetables can cause extra gas to form and this can also increase the bloating.  Drink plenty of fluids but you should avoid alcoholic beverages for 24 hours.  ACTIVITY: Your care partner should take you home directly after the procedure.  You should plan to take it easy, moving slowly for the rest of the day.  You can resume normal activity the day after the procedure however you should NOT DRIVE or use heavy machinery for 24 hours (because of the sedation medicines used during the test).    SYMPTOMS TO REPORT  IMMEDIATELY: A gastroenterologist can be reached at any hour.  During normal business hours, 8:30 AM to 5:00 PM Monday through Friday, call [phone].  After hours and on weekends, please call the GI answering service at [phone] who will take a message and have the physician on call contact you.   Following lower endoscopy (colonoscopy or flexible sigmoidoscopy):  Excessive amounts of blood in the stool  Significant tenderness or worsening of abdominal pains  Swelling of the abdomen that is new, acute  Fever of 100F or higher  Following upper endoscopy (EGD)  Vomiting of blood or coffee ground material  New chest pain or pain under the shoulder blades  Painful or persistently difficult swallowing  New shortness of breath  Fever of 100F or higher  Black, tarry-looking stools  FOLLOW UP: If any biopsies were taken you will be contacted by phone or by letter within the next 1-3 weeks.  Call your gastroenterologist if you have not heard about the biopsies in 3 weeks.  Our staff will call the home number listed on your records the next business day following your procedure to check on you and address any questions or concerns that you may have at that time regarding the information given to you following your procedure. This is a courtesy call and so if there is no answer at the home number and we have not heard from you through the emergency physician on  call, we will assume that you have returned to your regular daily activities without incident.  SIGNATURES/CONFIDENTIALITY: You and/or your care partner have signed paperwork which will be entered into your electronic medical record.  These signatures attest to the fact that that the information above on your After Visit Summary has been reviewed and is understood.  Full responsibility of the confidentiality of this discharge information lies with you and/or your care-partner.

## 2014-02-27 NOTE — Op Note (Signed)
[facility] [address] [place], [phone]   ENDOSCOPY PROCEDURE REPORT  PATIENT: Percell BostonWaken, Christina  MR#: [id number] BIRTHDATE: 05/17/1981 , 33  yrs. old GENDER: Female ENDOSCOPIST: Beverley FiedlerJay M Micca Matura, MD PROCEDURE DATE:  02/27/2014 PROCEDURE:  EGD w/ biopsy ASA CLASS:     Class II INDICATIONS:  Epigastric pain.   abdominal pain in upper left quadrant. MEDICATIONS: MAC sedation, administered by CRNA and propofol (Diprivan) 300mg  IV TOPICAL ANESTHETIC: none  DESCRIPTION OF PROCEDURE: After the risks benefits and alternatives of the procedure were thoroughly explained, informed consent was obtained.  The LB [id number] endoscope was introduced through the mouth and advanced to the second portion of the duodenum. Without limitations.  The instrument was slowly withdrawn as the mucosa was fully examined.     ESOPHAGUS: Mild edema in distal esophagus with regular Z-line (question reflux). The esophagus was otherwise normal.  STOMACH: Moderate erosive gastritis (inflammation) was found in the gastric body, at the incisura, and in the gastric antrum.  Biopsies were taken in the gastric body, antrum and angularis.  DUODENUM: The duodenal mucosa showed no abnormalities in the bulb and second portion of the duodenum.  Retroflexed views revealed no abnormalities.     The scope was then withdrawn from the patient and the procedure completed.  COMPLICATIONS: There were no complications. ENDOSCOPIC IMPRESSION: 1.   Mild edema in distal esophagus, possible reflux related; The esophagus was otherwise normal. 2.   Erosive gastritis (inflammation) was found in the gastric body, at the incisura, and in the gastric antrum; biopsies were taken in the antrum and angularis 3.   The duodenal mucosa showed no abnormalities in the bulb and second portion of the duodenum  RECOMMENDATIONS: 1.  Await biopsy results 2.  Avoid NSAIDs 3.  Follow-up of helicobacter pylori status,  treat if indicated  eSigned:  Beverley FiedlerJay M Truman Aceituno, MD 02/27/2014 10:07 AM CC:The Patient; Marva PandaKimberly Millsaps, MD

## 2014-02-27 NOTE — Progress Notes (Signed)
Procedure ends, to recovery, report given and VSS. 

## 2014-02-27 NOTE — Progress Notes (Signed)
Called to room to assist during endoscopic procedure.  Patient ID and intended procedure confirmed with present staff. Received instructions for my participation in the procedure from the performing physician.  

## 2014-02-28 ENCOUNTER — Telehealth: Payer: Self-pay | Admitting: *Deleted

## 2014-02-28 NOTE — Telephone Encounter (Signed)
No answer. Number identifier. Message left to call if any questions or concerns. 

## 2014-03-13 ENCOUNTER — Encounter: Payer: Self-pay | Admitting: Internal Medicine

## 2014-03-15 ENCOUNTER — Telehealth: Payer: Self-pay | Admitting: Internal Medicine

## 2014-03-15 NOTE — Telephone Encounter (Signed)
Left message for pt to call back  °

## 2014-03-15 NOTE — Telephone Encounter (Signed)
Pt  Called back and would like Dr. Rhea Belton to return her call on Tuesday.

## 2014-03-19 NOTE — Telephone Encounter (Signed)
I called and spoke to patient regarding her pain. She is having bilateral "side" pain occurring every morning without fail. This is not present when she goes to sleep but when she wakes up she feels this gnawing and dull pain bilaterally, left greater than right, and in the midaxillary line. This does not relate to eating or bowel movement. There is no nausea or vomiting. She is not taking any further NSAIDs and has been on twice a day pantoprazole since her gastritis diagnosis. No heartburn. No change in bowel habits including diarrhea, constipation. No blood in her stool or melena. No bloating. She is drinking about 100 ounces of water a day given her history of kidney stone. No dysuria. She has had upper endoscopy, previous CT scan, MRCP, and HIDA scan Symptoms are not suggestive of definite GI tract pain/pathology We discussed next, which is urology referral.  Please refer to Dr. Mena Goes or Dr. Laverle Patter.  If long wait, then any provider at [facility] is okay Okay to refill Vicodin at this time, she is using it once daily until we can elucidate the pain source I asked that she keep me up to date with urology's opinion

## 2014-03-20 ENCOUNTER — Telehealth: Payer: Self-pay | Admitting: Internal Medicine

## 2014-03-20 MED ORDER — HYDROCODONE-ACETAMINOPHEN 5-325 MG PO TABS: 1.0000 | ORAL_TABLET | Freq: Four times a day (QID) | ORAL | Status: DC | PRN

## 2014-03-20 NOTE — Telephone Encounter (Signed)
Rx created and awaiting Dr Pyrtle's signature. 

## 2014-03-24 ENCOUNTER — Encounter: Payer: Self-pay | Admitting: Internal Medicine

## 2014-03-26 NOTE — Telephone Encounter (Signed)
See email I recommend the lowest dose (1 tab) when possible, though I am okay if she uses 2 tabs on those mornings when her pain is most severe This can be done until urology consultation as scheduled Thank her for keeping me up to date

## 2014-04-05 ENCOUNTER — Other Ambulatory Visit: Payer: Self-pay | Admitting: Internal Medicine

## 2014-04-05 MED ORDER — HYDROCODONE-ACETAMINOPHEN 5-325 MG PO TABS: 1.0000 | ORAL_TABLET | Freq: Four times a day (QID) | ORAL | Status: DC | PRN

## 2014-04-05 NOTE — Telephone Encounter (Signed)
rx filled

## 2014-04-08 ENCOUNTER — Other Ambulatory Visit (HOSPITAL_COMMUNITY): Payer: Self-pay | Admitting: Urology

## 2014-04-08 DIAGNOSIS — N200 Calculus of kidney: Secondary | ICD-10-CM

## 2014-04-16 ENCOUNTER — Ambulatory Visit (HOSPITAL_COMMUNITY)
Admission: RE | Admit: 2014-04-16 | Discharge: 2014-04-16 | Disposition: A | Discharge status: Home / Self Care | Source: Ambulatory Visit | Attending: Urology | Admitting: Urology

## 2014-04-16 DIAGNOSIS — N200 Calculus of kidney: Secondary | ICD-10-CM

## 2014-04-16 DIAGNOSIS — Z87442 Personal history of urinary calculi: Secondary | ICD-10-CM | POA: Insufficient documentation

## 2014-04-18 ENCOUNTER — Ambulatory Visit (HOSPITAL_COMMUNITY): Admission: RE | Admit: 2014-04-18 | Source: Ambulatory Visit

## 2014-05-16 ENCOUNTER — Emergency Department (HOSPITAL_COMMUNITY)

## 2014-05-16 ENCOUNTER — Encounter (HOSPITAL_COMMUNITY): Payer: Self-pay | Admitting: Emergency Medicine

## 2014-05-16 ENCOUNTER — Emergency Department (HOSPITAL_COMMUNITY)
Admission: EM | Admit: 2014-05-16 | Discharge: 2014-05-16 | Disposition: A | Discharge status: Home / Self Care | Attending: Emergency Medicine | Admitting: Emergency Medicine

## 2014-05-16 DIAGNOSIS — Z87891 Personal history of nicotine dependence: Secondary | ICD-10-CM | POA: Insufficient documentation

## 2014-05-16 DIAGNOSIS — Z79899 Other long term (current) drug therapy: Secondary | ICD-10-CM | POA: Insufficient documentation

## 2014-05-16 DIAGNOSIS — R109 Unspecified abdominal pain: Secondary | ICD-10-CM | POA: Insufficient documentation

## 2014-05-16 DIAGNOSIS — G8929 Other chronic pain: Secondary | ICD-10-CM | POA: Diagnosis not present

## 2014-05-16 DIAGNOSIS — Z880 Allergy status to penicillin: Secondary | ICD-10-CM | POA: Diagnosis not present

## 2014-05-16 DIAGNOSIS — Z87442 Personal history of urinary calculi: Secondary | ICD-10-CM | POA: Diagnosis not present

## 2014-05-16 DIAGNOSIS — R112 Nausea with vomiting, unspecified: Secondary | ICD-10-CM | POA: Insufficient documentation

## 2014-05-16 DIAGNOSIS — Z862 Personal history of diseases of the blood and blood-forming organs and certain disorders involving the immune mechanism: Secondary | ICD-10-CM | POA: Insufficient documentation

## 2014-05-16 DIAGNOSIS — Z3202 Encounter for pregnancy test, result negative: Secondary | ICD-10-CM | POA: Diagnosis not present

## 2014-05-16 LAB — CBC WITH DIFFERENTIAL/PLATELET
Basophils Absolute: 0 10*3/uL (ref 0.0–0.1)
Basophils Relative: 0 % (ref 0–1)
Eosinophils Absolute: 0.1 10*3/uL (ref 0.0–0.7)
Eosinophils Relative: 1 % (ref 0–5)
HEMATOCRIT: 42.1 % (ref 36.0–46.0)
Hemoglobin: 13.7 g/dL (ref 12.0–15.0)
Lymphocytes Relative: 34 % (ref 12–46)
Lymphs Abs: 2.7 10*3/uL (ref 0.7–4.0)
MCH: 29.1 pg (ref 26.0–34.0)
MCHC: 32.5 g/dL (ref 30.0–36.0)
MCV: 89.4 fL (ref 78.0–100.0)
MONO ABS: 0.4 10*3/uL (ref 0.1–1.0)
Monocytes Relative: 5 % (ref 3–12)
NEUTROS ABS: 4.8 10*3/uL (ref 1.7–7.7)
NEUTROS PCT: 60 % (ref 43–77)
Platelets: 304 10*3/uL (ref 150–400)
RBC: 4.71 MIL/uL (ref 3.87–5.11)
RDW: 13.2 % (ref 11.5–15.5)
WBC: 8 10*3/uL (ref 4.0–10.5)

## 2014-05-16 LAB — BASIC METABOLIC PANEL
Anion gap: 13 (ref 5–15)
BUN: 14 mg/dL (ref 6–23)
CHLORIDE: 106 meq/L (ref 96–112)
CO2: 24 mEq/L (ref 19–32)
Calcium: 9.7 mg/dL (ref 8.4–10.5)
Creatinine, Ser: 0.8 mg/dL (ref 0.50–1.10)
GFR calc non Af Amer: >90 mL/min (ref >90)
Glucose, Bld: 79 mg/dL (ref 70–99)
Potassium: 4.2 mEq/L (ref 3.7–5.3)
Sodium: 143 mEq/L (ref 137–147)

## 2014-05-16 LAB — URINALYSIS, ROUTINE W REFLEX MICROSCOPIC
Bilirubin Urine: NEGATIVE
Glucose, UA: NEGATIVE mg/dL
KETONES UR: NEGATIVE mg/dL
NITRITE: NEGATIVE
PROTEIN: NEGATIVE mg/dL
Specific Gravity, Urine: 1.013 (ref 1.005–1.030)
Urobilinogen, UA: 0.2 mg/dL (ref 0.0–1.0)
pH: 6 (ref 5.0–8.0)

## 2014-05-16 LAB — URINE MICROSCOPIC-ADD ON

## 2014-05-16 LAB — PREGNANCY, URINE: Preg Test, Ur: NEGATIVE

## 2014-05-16 MED ORDER — HYDROCODONE-ACETAMINOPHEN 5-325 MG PO TABS: 1.0000 | ORAL_TABLET | Freq: Four times a day (QID) | ORAL | Status: DC | PRN

## 2014-05-16 MED ORDER — SODIUM CHLORIDE 0.9 % IV SOLN
INTRAVENOUS | Status: DC
  Administered 2014-05-16: 12:00:00 via INTRAVENOUS

## 2014-05-16 MED ORDER — HYDROMORPHONE HCL 1 MG/ML IJ SOLN
1.0000 mg | Freq: Once | INTRAMUSCULAR | Status: AC
  Administered 2014-05-16: 1 mg via INTRAVENOUS
  Filled 2014-05-16: qty 1

## 2014-05-16 MED ORDER — CIPROFLOXACIN HCL 500 MG PO TABS
500.0000 mg | ORAL_TABLET | Freq: Two times a day (BID) | ORAL | Status: DC
Start: 2014-05-16 — End: 2015-02-25

## 2014-05-16 MED ORDER — CIPROFLOXACIN HCL 500 MG PO TABS
500.0000 mg | ORAL_TABLET | Freq: Once | ORAL | Status: AC
  Administered 2014-05-16: 500 mg via ORAL
  Filled 2014-05-16: qty 1

## 2014-05-16 MED ORDER — ONDANSETRON HCL 4 MG/2ML IJ SOLN
4.0000 mg | Freq: Once | INTRAMUSCULAR | Status: AC
Start: 2014-05-16 — End: 2014-05-16
  Administered 2014-05-16: 4 mg via INTRAVENOUS
  Filled 2014-05-16: qty 2

## 2014-05-16 NOTE — Discharge Instructions (Signed)
It was our pleasure to provide your ER care today - we hope that you feel better.  Take motrin or aleve as need for pain. You may also take hydrocodone as need for pain. No driving for the next 6 hours or when taking hydrocodone. Also, do not take tylenol or acetaminophen containing medication when taking hydrocodone. The lab tests show a possible urine infection - take antibiotic as prescribed. Follow up with primary care doctor in the next couple days for recheck if symptoms fail to improve/resolve.  Return to ER if worse, new symptoms, worsening or severe pain, persistent vomiting, fevers, other concern.  You were given pain medication in the ER - no driving for the next 6 hours.      Abdominal Pain Many things can cause abdominal pain. Usually, abdominal pain is not caused by a disease and will improve without treatment. It can often be observed and treated at home. Your health care provider will do a physical exam and possibly order blood tests and X-rays to help determine the seriousness of your pain. However, in many cases, more time must pass before a clear cause of the pain can be found. Before that point, your health care provider may not know if you need more testing or further treatment. HOME CARE INSTRUCTIONS  Monitor your abdominal pain for any changes. The following actions may help to alleviate any discomfort you are experiencing:  Only take over-the-counter or prescription medicines as directed by your health care provider.  Do not take laxatives unless directed to do so by your health care provider.  Try a clear liquid diet (broth, tea, or water) as directed by your health care provider. Slowly move to a bland diet as tolerated. SEEK MEDICAL CARE IF:  You have unexplained abdominal pain.  You have abdominal pain associated with nausea or diarrhea.  You have pain when you urinate or have a bowel movement.  You experience abdominal pain that wakes you in the  night.  You have abdominal pain that is worsened or improved by eating food.  You have abdominal pain that is worsened with eating fatty foods.  You have a fever. SEEK IMMEDIATE MEDICAL CARE IF:   Your pain does not go away within 2 hours.  You keep throwing up (vomiting).  Your pain is felt only in portions of the abdomen, such as the right side or the left lower portion of the abdomen.  You pass bloody or black tarry stools. MAKE SURE YOU:  Understand these instructions.   Will watch your condition.   Will get help right away if you are not doing well or get worse.  Document Released: 04/07/2005 Document Revised: 07/03/2013 Document Reviewed: 03/07/2013 [facility] Patient Information 2015 [place], [place]. This information is not intended to replace advice given to you by your health care provider. Make sure you discuss any questions you have with your health care provider.      Flank Pain Flank pain refers to pain that is located on the side of the body between the upper abdomen and the back. The pain may occur over a short period of time (acute) or may be long-term or reoccurring (chronic). It may be mild or severe. Flank pain can be caused by many things. CAUSES  Some of the more common causes of flank pain include:  Muscle strains.   Muscle spasms.   A disease of your spine (vertebral disk disease).   A lung infection (pneumonia).   Fluid around your lungs (pulmonary  edema).   A kidney infection.   Kidney stones.   A very painful skin rash caused by the chickenpox virus (shingles).   Gallbladder disease.  HOME CARE INSTRUCTIONS  Home care will depend on the cause of your pain. In general,  Rest as directed by your caregiver.  Drink enough fluids to keep your urine clear or pale yellow.  Only take over-the-counter or prescription medicines as directed by your caregiver. Some medicines may help relieve the pain.  Tell your caregiver about any  changes in your pain.  Follow up with your caregiver as directed. SEEK IMMEDIATE MEDICAL CARE IF:   Your pain is not controlled with medicine.   You have new or worsening symptoms.  Your pain increases.   You have abdominal pain.   You have shortness of breath.   You have persistent nausea or vomiting.   You have swelling in your abdomen.   You feel faint or pass out.   You have blood in your urine.  You have a fever or persistent symptoms for more than 2-3 days.  You have a fever and your symptoms suddenly get worse. MAKE SURE YOU:   Understand these instructions.  Will watch your condition.  Will get help right away if you are not doing well or get worse. Document Released: 08/19/2005 Document Revised: 03/22/2012 Document Reviewed: 02/10/2012 [facility] Patient Information 2015 [place]Care, [place]LLC. This information is not intended to replace advice given to you by your health care provider. Make sure you discuss any questions you have with your health care provider.

## 2014-05-16 NOTE — ED Provider Notes (Signed)
CSN: [id number]     Arrival date & time 05/16/14  [id number] History   First MD Initiated Contact with Patient 05/16/14 1058     Chief Complaint  Patient presents with  . Abdominal Pain     (Consider location/radiation/quality/duration/timing/severity/associated sxs/prior Treatment) Patient is a 33 y.o. female presenting with abdominal pain. The history is provided by the patient.  Abdominal Pain Associated symptoms: nausea and vomiting   Associated symptoms: no chest pain, no chills, no dysuria, no fever and no shortness of breath   pt c/o acute onset left flank pain at approximately 3-4 am at rest. When went to bed last night felt fine, normal, asymptomatic. Acute onset severe flank pain.constant. Dull. Non radiating. Similar to prior kidney stone pain. No hematuria or dysuria. No fever or chills. +associated nv. Emesis clear, not bloody or bilious. Denies trauma or injury to area. No rash. No specific exacerbating or alleviating factors. Last period 2 weeks ago, normal. No vaginal discharge or bleeding. No anterior abd or pelvic pain.     Past Medical History  Diagnosis Date  . Kidney stones   . Anemia   . Chronic headaches    Past Surgical History  Procedure Laterality Date  . Stents in kidneys     Family History  Problem Relation Age of Onset  . Breast cancer Maternal Grandmother   . Breast cancer Maternal Aunt   . Breast cancer Maternal Grandmother     great grand   History  Substance Use Topics  . Smoking status: Former Smoker    Quit date: 07/13/2013  . Smokeless tobacco: Never Used     Comment: Patient quit on 07-13-2013  . Alcohol Use: No   OB History    No data available     Review of Systems  Constitutional: Negative for fever and chills.  Eyes: Negative for redness.  Respiratory: Negative for shortness of breath.   Cardiovascular: Negative for chest pain.  Gastrointestinal: Positive for nausea, vomiting and abdominal pain.  Endocrine: Negative for polyuria.   Genitourinary: Negative for dysuria and flank pain.  Musculoskeletal: Negative for back pain and neck pain.  Skin: Negative for rash.  Neurological: Negative for headaches.  Hematological: Does not bruise/bleed easily.  Psychiatric/Behavioral: Negative for confusion.      Allergies  Biaxin; Penicillins; and Tramadol  Home Medications   Prior to Admission medications   Medication Sig Start Date End Date Taking? Authorizing Provider  pantoprazole (PROTONIX) 40 MG tablet Take 1 tablet (40 mg total) by mouth 2 (two) times daily. 01/14/14  Yes Beverley FiedlerJay M Pyrtle, MD  Aspirin-Acetaminophen-Caffeine (GOODY HEADACHE PO) Take 1 packet by mouth as needed (for pain or headache).    Historical Provider, MD  HYDROcodone-acetaminophen (NORCO/VICODIN) 5-325 MG per tablet Take 1 tablet by mouth every 6 (six) hours as needed. 04/05/14   Beverley FiedlerJay M Pyrtle, MD   BP 112/69 mmHg  Pulse 95  Temp(Src) 98.4 F (36.9 C) (Oral)  Resp 20  SpO2 100%  LMP 05/01/2014 Physical Exam  Constitutional: She appears well-developed and well-nourished. No distress.  HENT:  Mouth/Throat: Oropharynx is clear and moist.  Eyes: Conjunctivae are normal. No scleral icterus.  Neck: Neck supple. No tracheal deviation present.  Cardiovascular: Normal rate, regular rhythm, normal heart sounds and intact distal pulses.   Pulmonary/Chest: Effort normal and breath sounds normal. No respiratory distress.  Abdominal: Soft. Normal appearance and bowel sounds are normal. She exhibits no distension and no mass. There is no tenderness. There is no rebound and  no guarding.  Genitourinary:  No cva tenderness  Musculoskeletal: She exhibits no edema.  Neurological: She is alert.  Skin: Skin is warm and dry. No rash noted. She is not diaphoretic.  No shingles/rash to area of pain  Psychiatric: She has a normal mood and affect.  Nursing note and vitals reviewed.   ED Course  Procedures (including critical care time) Labs Review   Results  for orders placed or performed during the hospital encounter of 05/16/14  Urinalysis, Routine w reflex microscopic  Result Value Ref Range   Color, Urine YELLOW YELLOW   APPearance CLOUDY (A) CLEAR   Specific Gravity, Urine 1.013 1.005 - 1.030   pH 6.0 5.0 - 8.0   Glucose, UA NEGATIVE NEGATIVE mg/dL   Hgb urine dipstick LARGE (A) NEGATIVE   Bilirubin Urine NEGATIVE NEGATIVE   Ketones, ur NEGATIVE NEGATIVE mg/dL   Protein, ur NEGATIVE NEGATIVE mg/dL   Urobilinogen, UA 0.2 0.0 - 1.0 mg/dL   Nitrite NEGATIVE NEGATIVE   Leukocytes, UA SMALL (A) NEGATIVE  CBC with Differential  Result Value Ref Range   WBC 8.0 4.0 - 10.5 K/uL   RBC 4.71 3.87 - 5.11 MIL/uL   Hemoglobin 13.7 12.0 - 15.0 g/dL   HCT 78.242.1 95.636.0 - 21.346.0 %   MCV 89.4 78.0 - 100.0 fL   MCH 29.1 26.0 - 34.0 pg   MCHC 32.5 30.0 - 36.0 g/dL   RDW 08.613.2 57.811.5 - 46.915.5 %   Platelets 304 150 - 400 K/uL   Neutrophils Relative % 60 43 - 77 %   Neutro Abs 4.8 1.7 - 7.7 K/uL   Lymphocytes Relative 34 12 - 46 %   Lymphs Abs 2.7 0.7 - 4.0 K/uL   Monocytes Relative 5 3 - 12 %   Monocytes Absolute 0.4 0.1 - 1.0 K/uL   Eosinophils Relative 1 0 - 5 %   Eosinophils Absolute 0.1 0.0 - 0.7 K/uL   Basophils Relative 0 0 - 1 %   Basophils Absolute 0.0 0.0 - 0.1 K/uL  Basic metabolic panel  Result Value Ref Range   Sodium 143 137 - 147 mEq/L   Potassium 4.2 3.7 - 5.3 mEq/L   Chloride 106 96 - 112 mEq/L   CO2 24 19 - 32 mEq/L   Glucose, Bld 79 70 - 99 mg/dL   BUN 14 6 - 23 mg/dL   Creatinine, Ser 6.290.80 0.50 - 1.10 mg/dL   Calcium 9.7 8.4 - 52.810.5 mg/dL   GFR calc non Af Amer >90 >90 mL/min   GFR calc Af Amer >90 >90 mL/min   Anion gap 13 5 - 15  Pregnancy, urine  Result Value Ref Range   Preg Test, Ur NEGATIVE NEGATIVE  Urine microscopic-add on  Result Value Ref Range   Squamous Epithelial / LPF FEW (A) RARE   WBC, UA 3-6 <3 WBC/hpf   RBC / HPF TOO NUMEROUS TO COUNT <3 RBC/hpf   Bacteria, UA FEW (A) RARE   Ct Abdomen Pelvis Wo  Contrast  05/16/2014   CLINICAL DATA:  Left lower quadrant pain with nausea and vomiting  EXAM: CT ABDOMEN AND PELVIS WITHOUT CONTRAST  TECHNIQUE: Multidetector CT imaging of the abdomen and pelvis was performed following the standard protocol without IV contrast.  COMPARISON:  No comparison studies are available. Correlation with ultrasound from 04/16/2014  FINDINGS: Chest:There is mild dependent atelectasis in the visualized lung bases.  Liver: The unenhanced appearance of the liver is unremarkable.  Gallbladder: There is no cholelithiasis.  The gallbladder wall is not thickened. There is no pericholecystic fluid. There is no intrahepatic or extrahepatic biliary ductal dilatation.  Spleen: The unenhanced appearance of the spleen is unremarkable.  Pancreas: The unenhanced appearance of the pancreas is unremarkable.  Adrenal glands: The adrenal glands are unremarkable.  Kidneys: There are 2 punctate, 1-2 millimeter nonobstructing stones in the mid right kidney. There are 2 additional 1-2 millimeter nonobstructing stones within the mid to lower pole of the left kidney.  Bowel/gastrointestinal tract: There is no bowel wall thickening. There is no evidence of bowel obstruction. The appendix is normal. A few scattered sigmoid diverticular present.  Pelvis: The uterus and ovaries are unremarkable. There is no pelvic mass.  Miscellaneous: There is no suspicious adenopathy.  Osseous structures: There is no acute osseous abnormality.  IMPRESSION: Tiny bilateral nonobstructing renal stones.   Electronically Signed   By: Fannie Knee   On: 05/16/2014 13:05      MDM  Iv ns. zofran iv. Dilaudid 1 mg iv.  Labs.  Reviewed nursing notes and prior charts for additional history.   Urine cloudy, le pos, w few bact and wbc - will culture and rx.  Recheck abd soft nt.  No nv.   Pt feels much improved on recheck, vitals normal, and appears stable for d/c.     Suzi Roots, MD 05/16/14 [id number]

## 2014-05-16 NOTE — ED Notes (Signed)
Bed: WA20 Expected date:  Expected time:  Means of arrival:  Comments: 

## 2014-05-16 NOTE — ED Notes (Signed)
Per pt, states LLQ pain with N/V that started early this am-no dysuria

## 2014-05-17 LAB — URINE CULTURE
CULTURE: NO GROWTH
Colony Count: NO GROWTH

## 2014-06-24 ENCOUNTER — Emergency Department (HOSPITAL_COMMUNITY)
Admission: EM | Admit: 2014-06-24 | Discharge: 2014-06-24 | Disposition: A | Discharge status: Home / Self Care | Attending: Emergency Medicine | Admitting: Emergency Medicine

## 2014-06-24 ENCOUNTER — Encounter (HOSPITAL_COMMUNITY): Payer: Self-pay | Admitting: Emergency Medicine

## 2014-06-24 DIAGNOSIS — Z87442 Personal history of urinary calculi: Secondary | ICD-10-CM | POA: Insufficient documentation

## 2014-06-24 DIAGNOSIS — Z880 Allergy status to penicillin: Secondary | ICD-10-CM | POA: Insufficient documentation

## 2014-06-24 DIAGNOSIS — G8929 Other chronic pain: Secondary | ICD-10-CM | POA: Insufficient documentation

## 2014-06-24 DIAGNOSIS — L0293 Carbuncle, unspecified: Secondary | ICD-10-CM | POA: Insufficient documentation

## 2014-06-24 DIAGNOSIS — Z792 Long term (current) use of antibiotics: Secondary | ICD-10-CM | POA: Diagnosis not present

## 2014-06-24 DIAGNOSIS — L739 Follicular disorder, unspecified: Secondary | ICD-10-CM

## 2014-06-24 DIAGNOSIS — Z87891 Personal history of nicotine dependence: Secondary | ICD-10-CM | POA: Diagnosis not present

## 2014-06-24 DIAGNOSIS — Z79899 Other long term (current) drug therapy: Secondary | ICD-10-CM | POA: Diagnosis not present

## 2014-06-24 DIAGNOSIS — L0291 Cutaneous abscess, unspecified: Secondary | ICD-10-CM

## 2014-06-24 DIAGNOSIS — K610 Anal abscess: Secondary | ICD-10-CM | POA: Insufficient documentation

## 2014-06-24 DIAGNOSIS — Z862 Personal history of diseases of the blood and blood-forming organs and certain disorders involving the immune mechanism: Secondary | ICD-10-CM | POA: Insufficient documentation

## 2014-06-24 MED ORDER — LIDOCAINE HCL 1 % IJ SOLN: 2.0000 mL | Freq: Once | INTRAMUSCULAR | Status: DC

## 2014-06-24 MED ORDER — SULFAMETHOXAZOLE-TRIMETHOPRIM 800-160 MG PO TABS: 1.0000 | ORAL_TABLET | Freq: Two times a day (BID) | ORAL | Status: DC

## 2014-06-24 NOTE — ED Provider Notes (Signed)
CSN: [id number]     Arrival date & time 06/24/14  1033 History   First MD Initiated Contact with Patient 06/24/14 1038     Chief Complaint  Patient presents with  . Abscess     (Consider location/radiation/quality/duration/timing/severity/associated sxs/prior Treatment) HPI The patient reports she's been developing a tender swollen spot on her left groin area right where the underwear sit. It's been developing for about a week and now it's gotten more painful. She reports she's had some problems in the past with getting bumps in that area that it had to be drained. She doesn't know why it keeps reoccurring. No associated symptoms. No fever no chills. Past Medical History  Diagnosis Date  . Kidney stones   . Anemia   . Chronic headaches    Past Surgical History  Procedure Laterality Date  . Stents in kidneys     Family History  Problem Relation Age of Onset  . Breast cancer Maternal Grandmother   . Breast cancer Maternal Aunt   . Breast cancer Maternal Grandmother     great grand   History  Substance Use Topics  . Smoking status: Former Smoker    Quit date: 07/13/2013  . Smokeless tobacco: Never Used     Comment: Patient quit on 07-13-2013  . Alcohol Use: No   OB History    No data available     Review of Systems  Constitutional no fever chills or malaise.  Allergies  Biaxin; Penicillins; and Tramadol  Home Medications   Prior to Admission medications   Medication Sig Start Date End Date Taking? Authorizing Provider  Aspirin-Acetaminophen-Caffeine (GOODY HEADACHE PO) Take 1 packet by mouth as needed (for pain or headache).    Historical Provider, MD  ciprofloxacin (CIPRO) 500 MG tablet Take 1 tablet (500 mg total) by mouth 2 (two) times daily. 05/16/14   Suzi RootsKevin E Steinl, MD  HYDROcodone-acetaminophen (NORCO/VICODIN) 5-325 MG per tablet Take 1 tablet by mouth every 6 (six) hours as needed. 04/05/14   Beverley FiedlerJay M Pyrtle, MD  HYDROcodone-acetaminophen (NORCO/VICODIN) 5-325 MG  per tablet Take 1-2 tablets by mouth every 6 (six) hours as needed for moderate pain. 05/16/14   Suzi RootsKevin E Steinl, MD  pantoprazole (PROTONIX) 40 MG tablet Take 1 tablet (40 mg total) by mouth 2 (two) times daily. 01/14/14   Beverley FiedlerJay M Pyrtle, MD  sulfamethoxazole-trimethoprim (BACTRIM DS,SEPTRA DS) 800-160 MG per tablet Take 1 tablet by mouth 2 (two) times daily. 06/24/14   Arby BarretteMarcy Jowel Waltner, MD   BP 133/77 mmHg  Pulse 77  Temp(Src) 98.5 F (36.9 C) (Oral)  Resp 18  SpO2 100%  LMP 06/22/2014 Physical Exam  Constitutional: She is oriented to person, place, and time. She appears well-developed and well-nourished. No distress.  Genitourinary:  The patient has shaved skin over the mons pubis and the upper thigh. In the left inguinal crease there is a 2 cm somewhat oblong erythematous indurated nodule. There is a fluctuant and to this. Just adjacent to this on the tissue the mons and there is a follicle that appears to have an acute folliculitis but does not have an active abscess present. Adjacent to it there is a pinhole opening that looks like an old fistula site. Findings are consistent with folliculitis with intermittent carbuncle formation. There is no associated cellulitis spreading over the other tissues.  Neurological: She is alert and oriented to person, place, and time.  Skin: Skin is warm and dry.  Psychiatric: She has a normal mood and affect.  ED Course  INCISION AND DRAINAGE Date/Time: 06/24/2014 11:40 AM Performed by: Arby BarrettePFEIFFER, Muntaha Vermette Authorized by: Arby BarrettePFEIFFER, Sarrah Fiorenza Consent: Verbal consent obtained. Type: abscess Body area: anogenital Location details: perineum Anesthesia: local infiltration Local anesthetic: lidocaine 1% with epinephrine Anesthetic total: 0.5 ml Scalpel size: 11 Incision type: single straight Complexity: simple Drainage: purulent Drainage amount: scant Wound treatment: wound left open Packing material: none Patient tolerance: Patient tolerated the procedure  well with no immediate complications Comments: Patient had a carbuncle present with one fluctuant portion to it. This fluctuant portion was drained 2 express about a half milliliter of pus. The remainder of it was predominantly indurated.   (including critical care time) Labs Review Labs Reviewed - No data to display  Imaging Review No results found.   EKG Interpretation None      MDM   Final diagnoses:  Abscess  Folliculitis  Carbuncle   The patient presents with some recurrent problems of abscess and carbuncle formation on the hairbearing area of the mons pubis and the inguinal crease. She is counseled to stop shaving the area due to the recurrence that she is suffering with this condition. At this point there is no associated cellulitis. The patient is counseled on applying warm compresses and antibiotic treatment.    Arby BarretteMarcy Jenisa Monty, MD 06/24/14 [phone]

## 2014-06-24 NOTE — ED Notes (Signed)
Pt has abscess to left groin area X 1 week.

## 2014-06-24 NOTE — Discharge Instructions (Signed)
Abscess °An abscess is an infected area that contains a collection of pus and debris. It can occur in almost any part of the body. An abscess is also known as a furuncle or boil. °CAUSES  °An abscess occurs when tissue gets infected. This can occur from blockage of oil or sweat glands, infection of hair follicles, or a minor injury to the skin. As the body tries to fight the infection, pus collects in the area and creates pressure under the skin. This pressure causes pain. People with weakened immune systems have difficulty fighting infections and get certain abscesses more often.  °SYMPTOMS °Usually an abscess develops on the skin and becomes a painful mass that is red, warm, and tender. If the abscess forms under the skin, you may feel a moveable soft area under the skin. Some abscesses break open (rupture) on their own, but most will continue to get worse without care. The infection can spread deeper into the body and eventually into the bloodstream, causing you to feel ill.  °DIAGNOSIS  °Your caregiver will take your medical history and perform a physical exam. A sample of fluid may also be taken from the abscess to determine what is causing your infection. °TREATMENT  °Your caregiver may prescribe antibiotic medicines to fight the infection. However, taking antibiotics alone usually does not cure an abscess. Your caregiver may need to make a small cut (incision) in the abscess to drain the pus. In some cases, gauze is packed into the abscess to reduce pain and to continue draining the area. °HOME CARE INSTRUCTIONS  °· Only take over-the-counter or prescription medicines for pain, discomfort, or fever as directed by your caregiver. °· If you were prescribed antibiotics, take them as directed. Finish them even if you start to feel better. °· If gauze is used, follow your caregiver's directions for changing the gauze. °· To avoid spreading the infection: °· Keep your draining abscess covered with a  bandage. °· Wash your hands well. °· Do not share personal care items, towels, or whirlpools with others. °· Avoid skin contact with others. °· Keep your skin and clothes clean around the abscess. °· Keep all follow-up appointments as directed by your caregiver. °SEEK MEDICAL CARE IF:  °· You have increased pain, swelling, redness, fluid drainage, or bleeding. °· You have muscle aches, chills, or a general ill feeling. °· You have a fever. °MAKE SURE YOU:  °· Understand these instructions. °· Will watch your condition. °· Will get help right away if you are not doing well or get worse. °Document Released: 04/07/2005 Document Revised: 12/28/2011 Document Reviewed: 09/10/2011 °ExitCare® Patient Information ©2015 [employer]. This information is not intended to replace advice given to you by your health care provider. Make sure you discuss any questions you have with your health care provider. °Folliculitis  °Folliculitis is redness, soreness, and swelling (inflammation) of the hair follicles. This condition can occur anywhere on the body. People with weakened immune systems, diabetes, or obesity have a greater risk of getting folliculitis. °CAUSES °· Bacterial infection. This is the most common cause. °· Fungal infection. °· Viral infection. °· Contact with certain chemicals, especially oils and tars. °Long-term folliculitis can result from bacteria that live in the nostrils. The bacteria may trigger multiple outbreaks of folliculitis over time. °SYMPTOMS °Folliculitis most commonly occurs on the scalp, thighs, legs, back, buttocks, and areas where hair is shaved frequently. An early sign of folliculitis is a small, white or yellow, pus-filled, itchy lesion (pustule). These lesions appear on   a red, inflamed follicle. They are usually less than 0.2 inches (5 mm) wide. When there is an infection of the follicle that goes deeper, it becomes a boil or furuncle. A group of closely packed boils creates a larger lesion  (carbuncle). Carbuncles tend to occur in hairy, sweaty areas of the body. °DIAGNOSIS  °Your caregiver can usually tell what is wrong by doing a physical exam. A sample may be taken from one of the lesions and tested in a lab. This can help determine what is causing your folliculitis. °TREATMENT  °Treatment may include: °· Applying warm compresses to the affected areas. °· Taking antibiotic medicines orally or applying them to the skin. °· Draining the lesions if they contain a large amount of pus or fluid. °· Laser hair removal for cases of long-lasting folliculitis. This helps to prevent regrowth of the hair. °HOME CARE INSTRUCTIONS °· Apply warm compresses to the affected areas as directed by your caregiver. °· If antibiotics are prescribed, take them as directed. Finish them even if you start to feel better. °· You may take over-the-counter medicines to relieve itching. °· Do not shave irritated skin. °· Follow up with your caregiver as directed. °SEEK IMMEDIATE MEDICAL CARE IF:  °· You have increasing redness, swelling, or pain in the affected area. °· You have a fever. °MAKE SURE YOU: °· Understand these instructions. °· Will watch your condition. °· Will get help right away if you are not doing well or get worse. °Document Released: 09/06/2001 Document Revised: 12/28/2011 Document Reviewed: 09/28/2011 °ExitCare® Patient Information ©2015 [employer]. This information is not intended to replace advice given to you by your health care provider. Make sure you discuss any questions you have with your health care provider. ° °

## 2014-10-10 IMAGING — US US RENAL
1 series · 14 of 25 positions shown · non-contrast
Comparison: CT 11/07/2013

CLINICAL DATA: Patient with history of renal stones.

EXAM:
RENAL/URINARY TRACT ULTRASOUND COMPLETE

[Series 1: us renal · 14 of 26 slices shown]
[im 1/26]
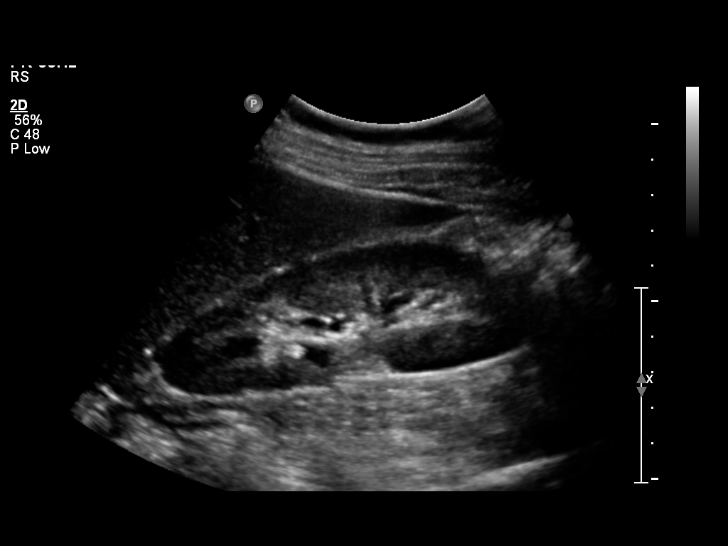
[im 3/26]
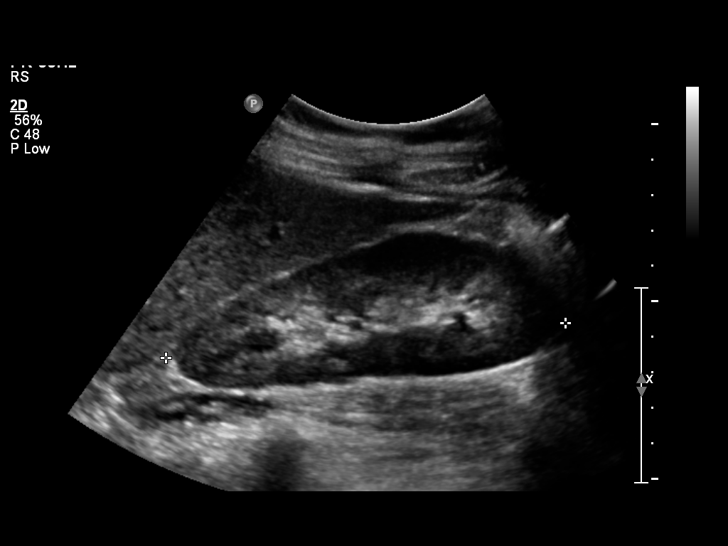
[im 5/26]
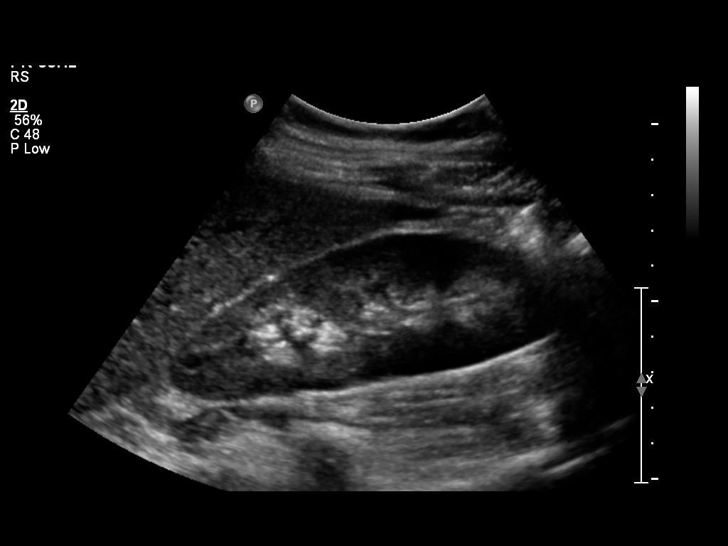
[im 7/26]
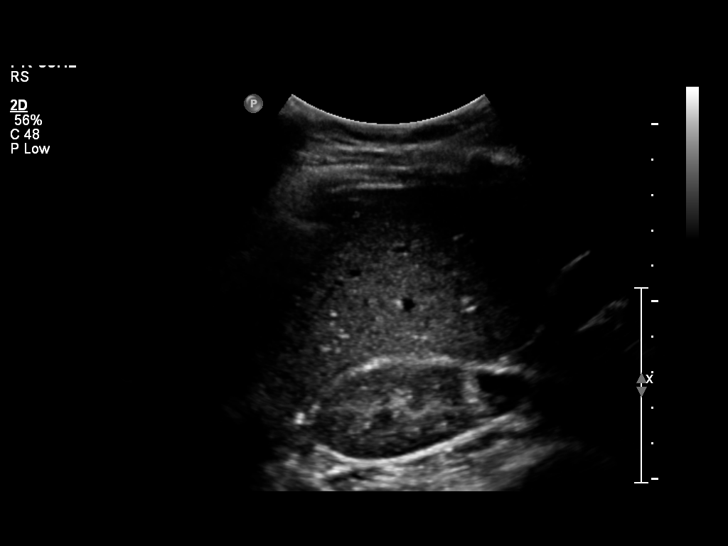
[im 9/26]
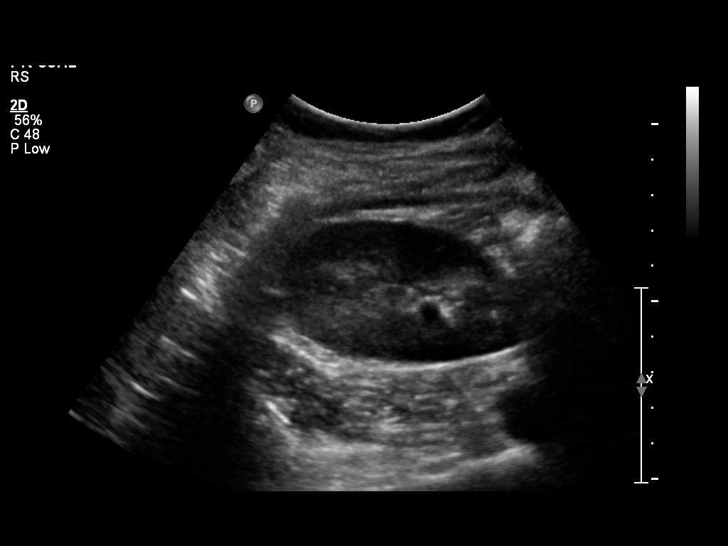
[im 10/26]
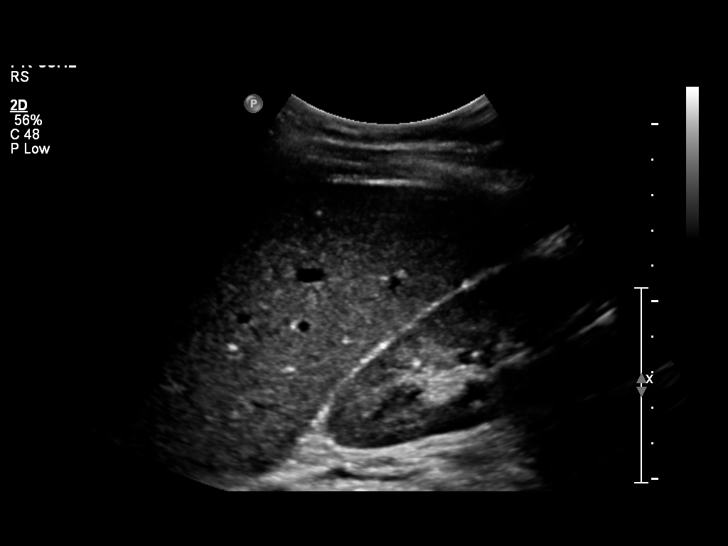
[im 12/26]
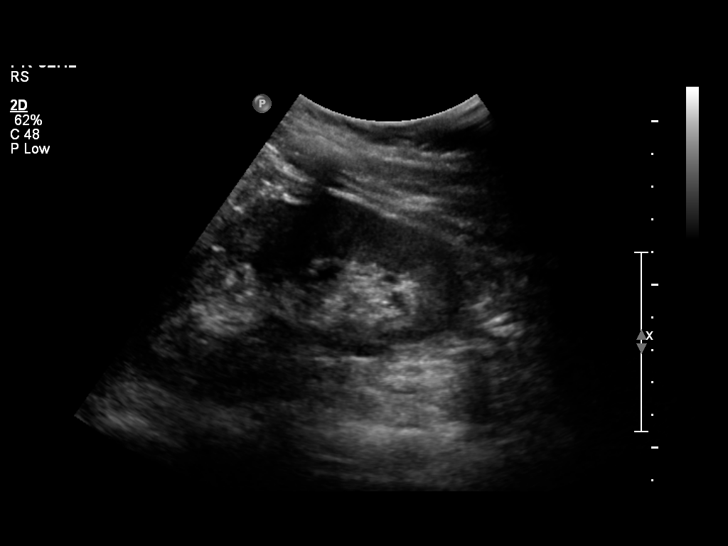
[im 14/26]
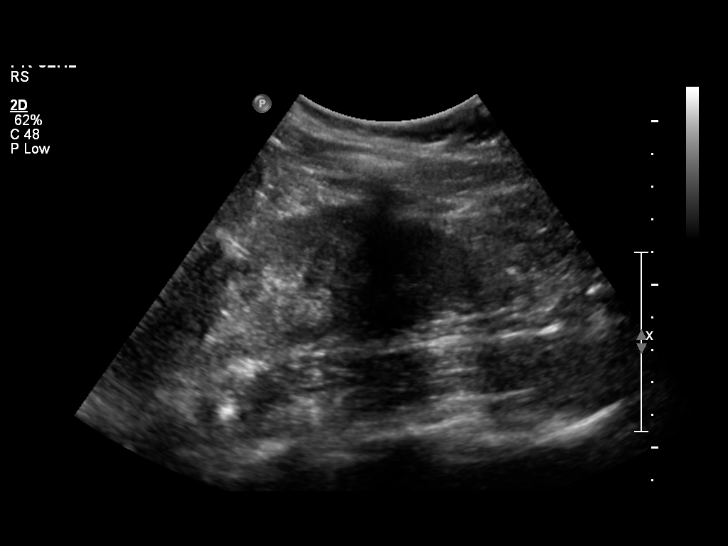
[im 16/26]
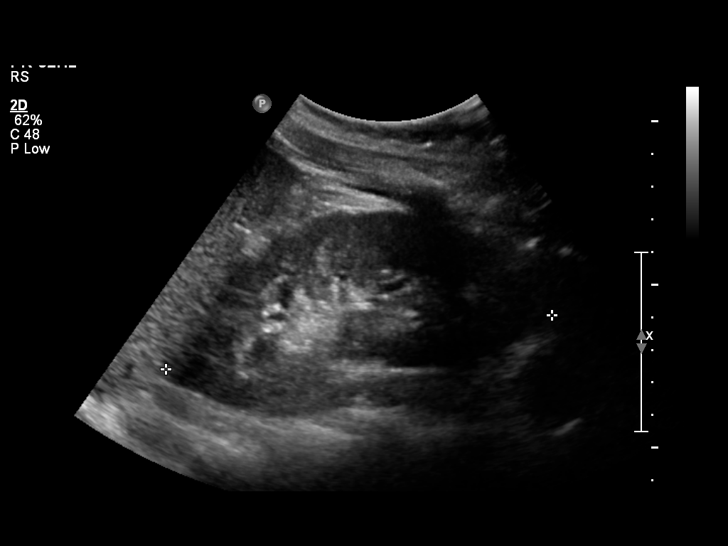
[im 17/26]
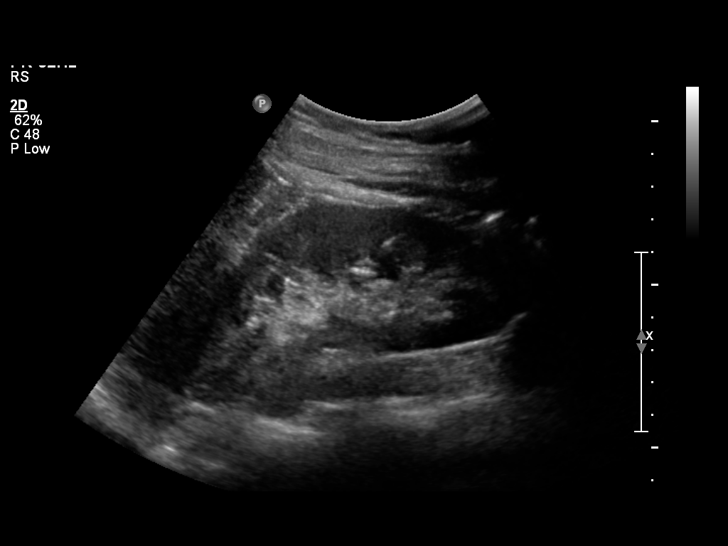
[im 19/26]
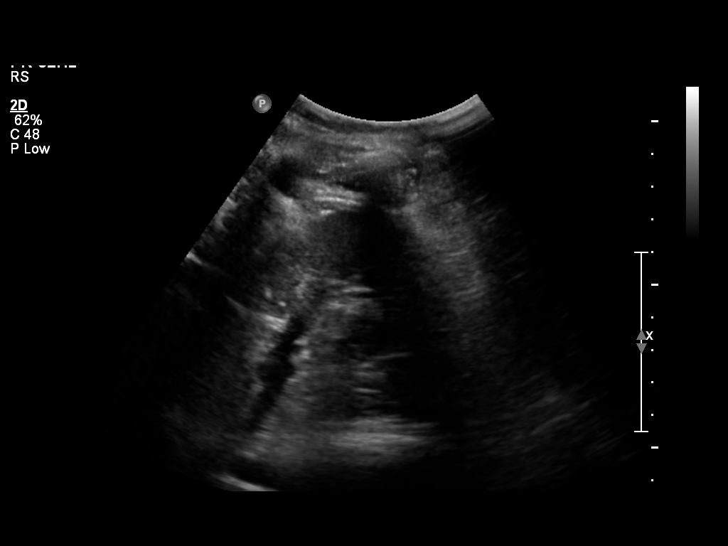
[im 21/26]
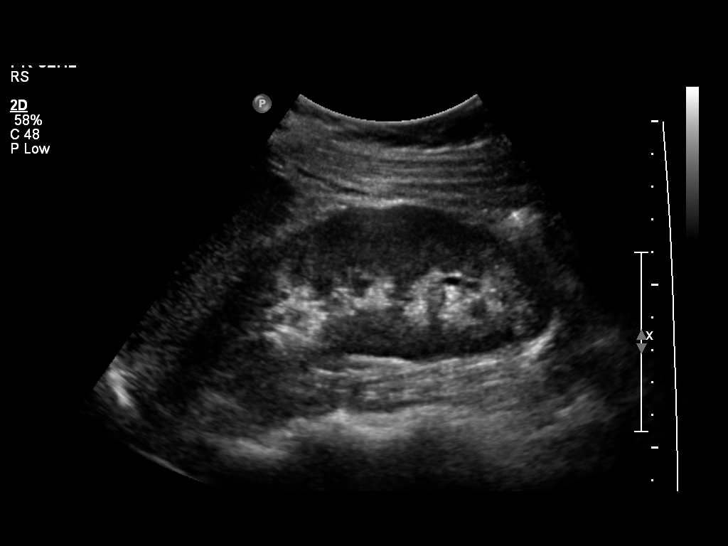
[im 23/26]
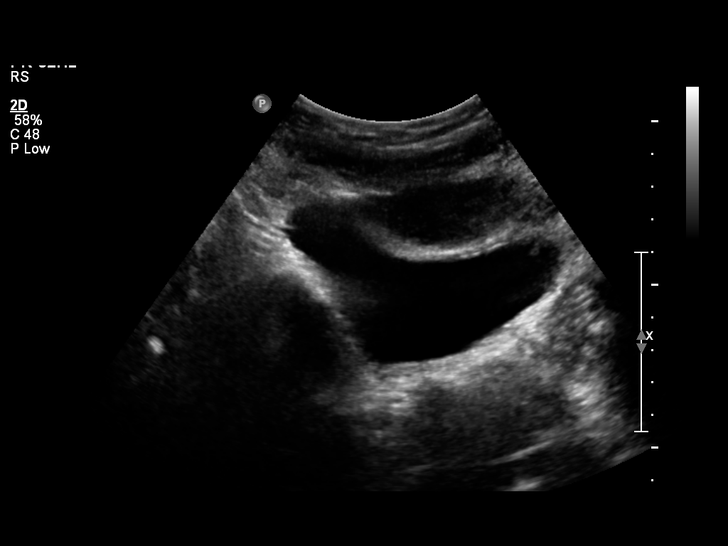
[im 26/26]
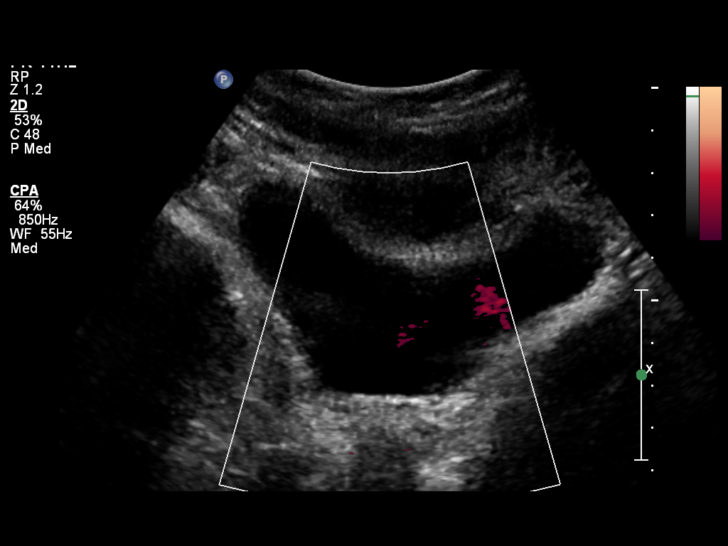

[14 of 25 positions shown; findings below may reference images not displayed]

FINDINGS: Right Kidney:

Length: 11.3 cm. Echogenicity within normal limits. No mass or
hydronephrosis visualized.

Left Kidney:

Length: 11.9 cm. Echogenicity within normal limits. No mass or
hydronephrosis visualized.

Bladder:

Appears normal for degree of bladder distention.
IMPRESSION: No hydronephrosis

## 2015-02-06 ENCOUNTER — Encounter: Admitting: Obstetrics & Gynecology

## 2015-02-25 ENCOUNTER — Encounter: Payer: Self-pay | Admitting: Obstetrics & Gynecology

## 2015-02-25 ENCOUNTER — Ambulatory Visit (INDEPENDENT_AMBULATORY_CARE_PROVIDER_SITE_OTHER): Admitting: Obstetrics & Gynecology

## 2015-02-25 VITALS — BP 118/72 | HR 90 | Resp 16 | Ht 65.0 in | Wt 148.0 lb

## 2015-02-25 DIAGNOSIS — Z3042 Encounter for surveillance of injectable contraceptive: Secondary | ICD-10-CM

## 2015-02-25 DIAGNOSIS — Z1151 Encounter for screening for human papillomavirus (HPV): Secondary | ICD-10-CM

## 2015-02-25 DIAGNOSIS — Z01419 Encounter for gynecological examination (general) (routine) without abnormal findings: Secondary | ICD-10-CM | POA: Diagnosis not present

## 2015-02-25 DIAGNOSIS — Z803 Family history of malignant neoplasm of breast: Secondary | ICD-10-CM | POA: Diagnosis not present

## 2015-02-25 DIAGNOSIS — Z124 Encounter for screening for malignant neoplasm of cervix: Secondary | ICD-10-CM | POA: Diagnosis not present

## 2015-02-25 DIAGNOSIS — Z01812 Encounter for preprocedural laboratory examination: Secondary | ICD-10-CM | POA: Diagnosis not present

## 2015-02-25 MED ORDER — MEDROXYPROGESTERONE ACETATE 150 MG/ML IM SUSP
150.0000 mg | Freq: Once | INTRAMUSCULAR | Status: AC
  Administered 2015-02-25: 150 mg via INTRAMUSCULAR

## 2015-02-25 NOTE — Progress Notes (Signed)
  Subjective:     Christina Stone is a 34 y.o. female here for a routine exam.  Current complaints: desires birth control.  Personal health questionnaire reviewed: yes.   Gynecologic History Patient's last menstrual period was 02/19/2015. Contraception: none Last Pap: 6 years ago--nml per patinet.   Obstetric History OB History  Gravida Para Term Preterm AB SAB TAB Ectopic Multiple Living  _0 # Outcome Date GA Lbr Len/2nd Weight Sex Delivery Anes PTL Lv  2 Term      Vag-Spont     1 Term      Vag-Spont        Family History  Problem Relation Age of Onset  . Breast cancer Maternal Grandmother   . Breast cancer Maternal Aunt   . Breast cancer Maternal Grandmother     great grand     The following portions of the patient's history were reviewed and updated as appropriate: allergies, current medications, past family history, past medical history, past social history, past surgical history and problem list.  Review of Systems A comprehensive review of systems was negative.    Objective:      Filed Vitals:   02/25/15 1455  BP: 118/72  Pulse: 90  Resp: 16  Height: 5' 5" (1.651 m)  Weight: 148 lb (67.132 kg)   Vitals:  WNL General appearance: alert, cooperative and no distress Head: Normocephalic, without obvious abnormality, atraumatic Eyes: negative Throat: lips, mucosa, and tongue normal; teeth and gums normal Lungs: clear to auscultation bilaterally Breasts: normal appearance, no masses or tenderness, No nipple retraction or dimpling, No nipple discharge or bleeding Heart: regular rate and rhythm Abdomen: soft, non-tender; bowel sounds normal; no masses,  no organomegaly  Pelvic:  External Genitalia:  Tanner V, no lesion Urethra:  No prolapse Vagina:  Pink, normal rugae, no blood or discharge Cervix:  No CMT, no lesion Uterus:  Normal size and contour, non tender Adnexa:  Normal, no masses, non tender  Extremities: no edema, redness or tenderness in  the calves or thighs Skin: no lesions or rash Lymph nodes: Axillary adenopathy: none       Assessment:    Healthy female exam.    Plan:     Pap smear with HPV testing UPT today Depakote today UPT is negative. Patient has not intercourse for greater than 2 weeks Information given on BRCA testing Return in 3 months for depo

## 2015-02-28 LAB — CYTOLOGY - PAP

## 2015-05-02 ENCOUNTER — Emergency Department (HOSPITAL_COMMUNITY)
Admission: EM | Admit: 2015-05-02 | Discharge: 2015-05-02 | Disposition: A | Discharge status: Home / Self Care | Attending: Emergency Medicine | Admitting: Emergency Medicine

## 2015-05-02 ENCOUNTER — Emergency Department (HOSPITAL_COMMUNITY)

## 2015-05-02 ENCOUNTER — Encounter (HOSPITAL_COMMUNITY): Payer: Self-pay | Admitting: Emergency Medicine

## 2015-05-02 DIAGNOSIS — Z3202 Encounter for pregnancy test, result negative: Secondary | ICD-10-CM | POA: Insufficient documentation

## 2015-05-02 DIAGNOSIS — G8929 Other chronic pain: Secondary | ICD-10-CM | POA: Insufficient documentation

## 2015-05-02 DIAGNOSIS — Z87442 Personal history of urinary calculi: Secondary | ICD-10-CM | POA: Diagnosis not present

## 2015-05-02 DIAGNOSIS — Z720 Tobacco use: Secondary | ICD-10-CM | POA: Insufficient documentation

## 2015-05-02 DIAGNOSIS — Z862 Personal history of diseases of the blood and blood-forming organs and certain disorders involving the immune mechanism: Secondary | ICD-10-CM | POA: Insufficient documentation

## 2015-05-02 DIAGNOSIS — Z880 Allergy status to penicillin: Secondary | ICD-10-CM | POA: Insufficient documentation

## 2015-05-02 DIAGNOSIS — Z8739 Personal history of other diseases of the musculoskeletal system and connective tissue: Secondary | ICD-10-CM | POA: Diagnosis not present

## 2015-05-02 DIAGNOSIS — Z79899 Other long term (current) drug therapy: Secondary | ICD-10-CM | POA: Diagnosis not present

## 2015-05-02 DIAGNOSIS — R112 Nausea with vomiting, unspecified: Secondary | ICD-10-CM | POA: Diagnosis present

## 2015-05-02 DIAGNOSIS — N23 Unspecified renal colic: Secondary | ICD-10-CM | POA: Insufficient documentation

## 2015-05-02 LAB — CBC WITH DIFFERENTIAL/PLATELET
BASOS ABS: 0 10*3/uL (ref 0.0–0.1)
Basophils Relative: 0 %
EOS PCT: 1 %
Eosinophils Absolute: 0.1 10*3/uL (ref 0.0–0.7)
HCT: 38.8 % (ref 36.0–46.0)
Hemoglobin: 13 g/dL (ref 12.0–15.0)
LYMPHS PCT: 29 %
Lymphs Abs: 2.4 10*3/uL (ref 0.7–4.0)
MCH: 30.9 pg (ref 26.0–34.0)
MCHC: 33.5 g/dL (ref 30.0–36.0)
MCV: 92.2 fL (ref 78.0–100.0)
MONO ABS: 0.4 10*3/uL (ref 0.1–1.0)
Monocytes Relative: 5 %
Neutro Abs: 5.3 10*3/uL (ref 1.7–7.7)
Neutrophils Relative %: 65 %
PLATELETS: 281 10*3/uL (ref 150–400)
RBC: 4.21 MIL/uL (ref 3.87–5.11)
RDW: 13.7 % (ref 11.5–15.5)
WBC: 8.2 10*3/uL (ref 4.0–10.5)

## 2015-05-02 LAB — COMPREHENSIVE METABOLIC PANEL
ALBUMIN: 4.9 g/dL (ref 3.5–5.0)
ALK PHOS: 44 U/L (ref 38–126)
ALT: 19 U/L (ref 14–54)
AST: 24 U/L (ref 15–41)
Anion gap: 14 (ref 5–15)
BUN: 8 mg/dL (ref 6–20)
CO2: 17 mmol/L — AB (ref 22–32)
CREATININE: 0.66 mg/dL (ref 0.44–1.00)
Calcium: 9.8 mg/dL (ref 8.9–10.3)
Chloride: 105 mmol/L (ref 101–111)
GFR calc Af Amer: >60 mL/min (ref >60)
GFR calc non Af Amer: >60 mL/min (ref >60)
GLUCOSE: 76 mg/dL (ref 65–99)
Potassium: 3.9 mmol/L (ref 3.5–5.1)
SODIUM: 136 mmol/L (ref 135–145)
Total Bilirubin: 1.1 mg/dL (ref 0.3–1.2)
Total Protein: 7.6 g/dL (ref 6.5–8.1)

## 2015-05-02 LAB — URINALYSIS, ROUTINE W REFLEX MICROSCOPIC
BILIRUBIN URINE: NEGATIVE
Glucose, UA: NEGATIVE mg/dL
Ketones, ur: 15 mg/dL — AB
Leukocytes, UA: NEGATIVE
Nitrite: NEGATIVE
PH: 6 (ref 5.0–8.0)
Protein, ur: NEGATIVE mg/dL
SPECIFIC GRAVITY, URINE: 1.005 (ref 1.005–1.030)
Urobilinogen, UA: 0.2 mg/dL (ref 0.0–1.0)

## 2015-05-02 LAB — URINE MICROSCOPIC-ADD ON

## 2015-05-02 LAB — LIPASE, BLOOD: Lipase: 33 U/L (ref 11–51)

## 2015-05-02 LAB — HCG, QUANTITATIVE, PREGNANCY: hCG, Beta Chain, Quant, S: <1 m[IU]/mL (ref <5)

## 2015-05-02 MED ORDER — ONDANSETRON HCL 4 MG/2ML IJ SOLN
4.0000 mg | Freq: Once | INTRAMUSCULAR | Status: AC | PRN
  Administered 2015-05-02: 4 mg via INTRAVENOUS
  Filled 2015-05-02: qty 2

## 2015-05-02 MED ORDER — OXYCODONE-ACETAMINOPHEN 5-325 MG PO TABS: 1.0000 | ORAL_TABLET | ORAL | Status: DC | PRN

## 2015-05-02 MED ORDER — KETOROLAC TROMETHAMINE 30 MG/ML IJ SOLN
30.0000 mg | Freq: Once | INTRAMUSCULAR | Status: AC
  Administered 2015-05-02: 30 mg via INTRAVENOUS
  Filled 2015-05-02: qty 1

## 2015-05-02 MED ORDER — OXYCODONE-ACETAMINOPHEN 5-325 MG PO TABS
1.0000 | ORAL_TABLET | Freq: Once | ORAL | Status: AC
  Administered 2015-05-02: 1 via ORAL
  Filled 2015-05-02: qty 1

## 2015-05-02 NOTE — Discharge Instructions (Signed)
Kidney Stones °Kidney stones (urolithiasis) are deposits that form inside your kidneys. The intense pain is caused by the stone moving through the urinary tract. When the stone moves, the ureter goes into spasm around the stone. The stone is usually passed in the urine.  °CAUSES  °· A disorder that makes certain neck glands produce too much parathyroid hormone (primary hyperparathyroidism). °· A buildup of uric acid crystals, similar to gout in your joints. °· Narrowing (stricture) of the ureter. °· A kidney obstruction present at birth (congenital obstruction). °· Previous surgery on the kidney or ureters. °· Numerous kidney infections. °SYMPTOMS  °· Feeling sick to your stomach (nauseous). °· Throwing up (vomiting). °· Blood in the urine (hematuria). °· Pain that usually spreads (radiates) to the groin. °· Frequency or urgency of urination. °DIAGNOSIS  °· Taking a history and physical exam. °· Blood or urine tests. °· CT scan. °· Occasionally, an examination of the inside of the urinary bladder (cystoscopy) is performed. °TREATMENT  °· Observation. °· Increasing your fluid intake. °· Extracorporeal shock wave lithotripsy--This is a noninvasive procedure that uses shock waves to break up kidney stones. °· Surgery may be needed if you have severe pain or persistent obstruction. There are various surgical procedures. Most of the procedures are performed with the use of small instruments. Only small incisions are needed to accommodate these instruments, so recovery time is minimized. °The size, location, and chemical composition are all important variables that will determine the proper choice of action for you. Talk to your health care provider to better understand your situation so that you will minimize the risk of injury to yourself and your kidney.  °HOME CARE INSTRUCTIONS  °· Drink enough water and fluids to keep your urine clear or pale yellow. This will help you to pass the stone or stone fragments. °· Strain  all urine through the provided strainer. Keep all particulate matter and stones for your health care provider to see. The stone causing the pain may be as small as a grain of salt. It is very important to use the strainer each and every time you pass your urine. The collection of your stone will allow your health care provider to analyze it and verify that a stone has actually passed. The stone analysis will often identify what you can do to reduce the incidence of recurrences. °· Only take over-the-counter or prescription medicines for pain, discomfort, or fever as directed by your health care provider. °· Keep all follow-up visits as told by your health care provider. This is important. °· Get follow-up X-rays if required. The absence of pain does not always mean that the stone has passed. It may have only stopped moving. If the urine remains completely obstructed, it can cause loss of kidney function or even complete destruction of the kidney. It is your responsibility to make sure X-rays and follow-ups are completed. Ultrasounds of the kidney can show blockages and the status of the kidney. Ultrasounds are not associated with any radiation and can be performed easily in a matter of minutes. °· Make changes to your daily diet as told by your health care provider. You may be told to: °¨ Limit the amount of salt that you eat. °¨ Eat 5 or more servings of fruits and vegetables each day. °¨ Limit the amount of meat, poultry, fish, and eggs that you eat. °· Collect a 24-hour urine sample as told by your health care provider. You may need to collect another urine sample every 6-12   months. °SEEK MEDICAL CARE IF: °· You experience pain that is progressive and unresponsive to any pain medicine you have been prescribed. °SEEK IMMEDIATE MEDICAL CARE IF:  °· Pain cannot be controlled with the prescribed medicine. °· You have a fever or shaking chills. °· The severity or intensity of pain increases over 18 hours and is not  relieved by pain medicine. °· You develop a new onset of abdominal pain. °· You feel faint or pass out. °· You are unable to urinate. °  °This information is not intended to replace advice given to you by your health care provider. Make sure you discuss any questions you have with your health care provider. °  °Document Released: 06/28/2005 Document Revised: 03/19/2015 Document Reviewed: 11/29/2012 °Elsevier Interactive Patient Education ©2016 [employer] ° °

## 2015-05-02 NOTE — ED Notes (Signed)
Patient c/o left sided abd pain with N/V. Patient states symptoms started @12  hours ago, worsening @ 3 hours ago. Emesis x4 since onset.

## 2015-05-02 NOTE — ED Notes (Signed)
MD at bedside. 

## 2015-05-02 NOTE — ED Provider Notes (Signed)
CSN: [id number]     Arrival date & time 05/02/15  0721 History   First MD Initiated Contact with Patient 05/02/15 [phone]     Chief Complaint  Patient presents with  . Abdominal Pain    left  . Emesis     (Consider location/radiation/quality/duration/timing/severity/associated sxs/prior Treatment) Patient is a 34 y.o. female presenting with abdominal pain and vomiting. The history is provided by the patient.  Abdominal Pain Pain location:  L flank Pain quality: stabbing   Pain radiates to:  Does not radiate Pain severity:  Moderate Onset quality:  Gradual Duration:  1 day Timing:  Constant Progression:  Unchanged Chronicity:  Recurrent ("feels like kidney stone") Context: previous surgery   Relieved by:  Nothing Worsened by:  Nothing tried Ineffective treatments:  None tried Associated symptoms: nausea and vomiting   Associated symptoms: no fever   Vomiting:    Number of occurrences:  5   Severity:  Moderate   Timing:  Constant   Progression:  Improving Risk factors: no alcohol abuse   Risk factors comment:  Stent x2 previously Emesis Associated symptoms: abdominal pain     Past Medical History  Diagnosis Date  . Kidney stones   . Anemia   . Chronic headaches   . Bulging lumbar disc   . Abnormal Pap smear of cervix    Past Surgical History  Procedure Laterality Date  . Stents in kidneys    . Wisdom tooth extraction     Family History  Problem Relation Age of Onset  . Breast cancer Maternal Grandmother   . Breast cancer Maternal Aunt   . Breast cancer Maternal Grandmother     great grand   Social History  Substance Use Topics  . Smoking status: Current Every Day Smoker -- 0.90 packs/day for 15 years    Types: Cigarettes  . Smokeless tobacco: Never Used  . Alcohol Use: No     Comment: soc   OB History    Gravida Para Term Preterm AB TAB SAB Ectopic Multiple Living   Review of Systems  Constitutional: Negative for fever.   Gastrointestinal: Positive for nausea, vomiting and abdominal pain.  All other systems reviewed and are negative.     Allergies  Biaxin; Penicillins; and Tramadol  Home Medications   Prior to Admission medications   Medication Sig Start Date End Date Taking? Authorizing Provider  Aspirin-Acetaminophen-Caffeine (GOODY HEADACHE PO) Take 1 packet by mouth as needed (for pain or headache).    Historical Provider, MD  gabapentin (NEURONTIN) 300 MG capsule TAKE ONE CAPSULE BY MOUTH 2-3 TIMES A DAY AS NEEDED FOR PAIN **MUST KEEP F/U APPT FOR FURTHER RFS** 02/03/15   Historical Provider, MD  pantoprazole (PROTONIX) 40 MG tablet Take 1 tablet (40 mg total) by mouth 2 (two) times daily. 01/14/14   Beverley Fiedler, MD   BP 143/88 mmHg  Pulse 105  Temp(Src) 98 F (36.7 C) (Oral)  Resp 16  SpO2 99% Physical Exam  Constitutional: She is oriented to person, place, and time. She appears well-developed and well-nourished. No distress.  HENT:  Head: Normocephalic.  Eyes: Conjunctivae are normal.  Neck: Neck supple. No tracheal deviation present.  Cardiovascular: Normal rate, regular rhythm and normal heart sounds.   Pulmonary/Chest: Effort normal and breath sounds normal. No respiratory distress.  Abdominal: Soft. She exhibits no distension. There is no tenderness. There is CVA tenderness (on left).  Neurological:  She is alert and oriented to person, place, and time.  Skin: Skin is warm and dry.  Psychiatric: She has a normal mood and affect.    ED Course  Procedures (including critical care time)  Emergency Focused Ultrasound Exam Limited Retroperitoneal Ultrasound of Kidneys  Performed and interpreted by Dr. Clydene PughKnott Focused abdominal ultrasound with both kidneys imaged in transverse and longitudinal planes in real-time. Indication: flank pain Findings: bilateral kidneys present, no shadowing, several small anechoic areas on left Interpretation: minimal left hydronephrosis visualized.  no  stones or cysts visualized  Images archived electronically  CPT Code: [id number]   Labs Review Labs Reviewed  COMPREHENSIVE METABOLIC PANEL - Abnormal; Notable for the following:    CO2 17 (*)    All other components within normal limits  URINALYSIS, ROUTINE W REFLEX MICROSCOPIC (NOT AT [facility]) - Abnormal; Notable for the following:    APPearance CLOUDY (*)    Hgb urine dipstick LARGE (*)    Ketones, ur 15 (*)    All other components within normal limits  URINE MICROSCOPIC-ADD ON - Abnormal; Notable for the following:    Bacteria, UA FEW (*)    All other components within normal limits  LIPASE, BLOOD  HCG, QUANTITATIVE, PREGNANCY  CBC WITH DIFFERENTIAL/PLATELET    Imaging Review Dg Abd 1 View  05/02/2015  CLINICAL DATA:  Left flank pain for 12 hours, worsening the last 3 hours. EXAM: ABDOMEN - 1 VIEW COMPARISON:  Lumbar spine films 09/04/2014 FINDINGS: Punctate calcification projects over the lower pole of the right kidney, likely small nonobstructing stone. No suspicious calcifications over the left kidney or expected course of the ureters. Normal bowel gas pattern. No organomegaly or free air. IMPRESSION: Suspect punctate right lower pole nephrolithiasis. Electronically Signed   By: Charlett NoseKevin  Dover M.D.   On: 05/02/2015 08:34   I have personally reviewed and evaluated these images and lab results as part of my medical decision-making.   EKG Interpretation None      MDM   Final diagnoses:  Renal colic on left side    34 year old female presents with left flank pain that started overnight. She had some vomiting secondary to ongoing pain. She is uncomfortable on arrival and clutching her left side. She has had multiple previous kidney stones. Narcotic database shows no recent controlled prescriptions. Minimal hydro on left side is evident on bedside ultrasound, microscopic hematuria is evident, no stone is visualized on KUB, do not suspect large obstructing stone and patient is  otherwise healthy. Will plan for symptomatic management and follow-up on an outpatient basis with urology for repeat imaging is indicated and further management. Plan to follow up with PCP as needed and return precautions discussed for worsening or new concerning symptoms.     Lyndal Pulleyaniel Yuko Coventry, MD 05/03/15 [phone]

## 2015-05-20 ENCOUNTER — Ambulatory Visit (INDEPENDENT_AMBULATORY_CARE_PROVIDER_SITE_OTHER): Admitting: *Deleted

## 2015-05-20 ENCOUNTER — Encounter: Payer: Self-pay | Admitting: *Deleted

## 2015-05-20 DIAGNOSIS — Z3042 Encounter for surveillance of injectable contraceptive: Secondary | ICD-10-CM

## 2015-05-20 MED ORDER — MEDROXYPROGESTERONE ACETATE 150 MG/ML IM SUSP
150.0000 mg | INTRAMUSCULAR | Status: AC
  Administered 2015-05-20: 150 mg via INTRAMUSCULAR

## 2015-06-27 ENCOUNTER — Ambulatory Visit: Admitting: Internal Medicine

## 2015-07-01 ENCOUNTER — Ambulatory Visit (INDEPENDENT_AMBULATORY_CARE_PROVIDER_SITE_OTHER): Admitting: Internal Medicine

## 2015-07-01 ENCOUNTER — Encounter: Payer: Self-pay | Admitting: Internal Medicine

## 2015-07-01 VITALS — BP 98/60 | HR 80 | Ht 65.0 in | Wt 119.0 lb

## 2015-07-01 DIAGNOSIS — K30 Functional dyspepsia: Secondary | ICD-10-CM

## 2015-07-01 DIAGNOSIS — K219 Gastro-esophageal reflux disease without esophagitis: Secondary | ICD-10-CM | POA: Diagnosis not present

## 2015-07-01 DIAGNOSIS — R1013 Epigastric pain: Secondary | ICD-10-CM

## 2015-07-01 MED ORDER — PANTOPRAZOLE SODIUM 40 MG PO TBEC: 40.0000 mg | DELAYED_RELEASE_TABLET | Freq: Two times a day (BID) | ORAL | Status: DC

## 2015-07-01 NOTE — Progress Notes (Signed)
   Subjective:    Patient ID: Christina Stone, female    DOB: [birth year], 34 y.o.   MRN: [id number]  HPI Christina Stone is a 34 yo female with PMH of gastritis, indigestion, kidney stones who is seen in follow-up. She was initially seen in summer of 2015 to evaluate indigestion but also bilateral upper abdominal pain. She had an extensive workup which included upper endoscopy. This showed gastritis without H. pylori or metaplasia. Ultrasound, CT and HIDA scan were performed all of which were unrevealing. She was treated with twice a day PPI which improved indigestion but her upper abdominal pain continued. She was sent to urology for evaluation of her kidney stones. No etiology to this pain was ever discovered but it slowly resolved. It has been gone for greater than 6 months. She eventually weaned herself off of pantoprazole and has been off of this medication for about 6 months. Over the last 2-3 months she's had return of indigestion, frequent belching and regurgitation after meals. This is worse at night.  She started working out and eating the [employer] diet because she had gained weight. She gained up to 150 pounds and has now lost about 30 pounds intentionally. She denies dysphagia. No abdominal pain. She is avoiding NSAIDs. Bowel movements are regular without blood in her stool or melena. She's tried multiple over-the-counter short acting remedies including Tums, Rolaids, Gaviscon all of which help but only transiently  Review of Systems As per HPI, otherwise negative.  Current Medications, Allergies, Past Medical History, Past Surgical History, Family History and Social History were reviewed in [facility] electronic medical record.     Objective:   Physical Exam BP 98/60 mmHg  Pulse 80  Ht 5\' 5"  (1.651 m)  Wt 119 lb (53.978 kg)  BMI 19.80 kg/m2 Constitutional: Well-developed and well-nourished. No distress. HEENT: Normocephalic and atraumatic. Oropharynx is clear and moist. No  oropharyngeal exudate. Conjunctivae are normal.  No scleral icterus. Neck: Neck supple. Trachea midline. Cardiovascular: Normal rate, regular rhythm and intact distal pulses. No M/R/G Pulmonary/chest: Effort normal and breath sounds normal. No wheezing, rales or rhonchi. Abdominal: Soft, nontender, nondistended. Bowel sounds active throughout. There are no masses palpable. No hepatosplenomegaly. Extremities: no clubbing, cyanosis, or edema Lymphadenopathy: No cervical adenopathy noted. Neurological: Alert and oriented to person place and time. Skin: Skin is warm and dry. No rashes noted. Psychiatric: Normal mood and affect. Behavior is normal.      Assessment & Plan:  34 year old female seen for follow-up complaining of heartburn, indigestion  1. GERD -- symptoms consistent with GERD. We will resume pantoprazole 40 mg twice a day before meals 1 month then decrease to once daily 30 minutes before breakfast. GERD diet. Call if medication does not relieve symptoms. Over-the-counter Gaviscon can be used per bottle instructions for breakthrough symptoms.  2. Bilateral upper abdominal pain -- resolved without clear etiology.  Return in one year, sooner if necessary

## 2015-07-01 NOTE — Patient Instructions (Signed)
We have sent the following medications to your pharmacy for you to pick up at your convenience: Pantoprazole 40 mg twice daily x 1 month, then once daily x 2 months  Please follow up in 1 year.

## 2015-08-01 ENCOUNTER — Encounter: Payer: Self-pay | Admitting: Obstetrics & Gynecology

## 2015-08-12 ENCOUNTER — Ambulatory Visit (INDEPENDENT_AMBULATORY_CARE_PROVIDER_SITE_OTHER): Admitting: *Deleted

## 2015-08-12 DIAGNOSIS — Z3042 Encounter for surveillance of injectable contraceptive: Secondary | ICD-10-CM

## 2015-11-04 ENCOUNTER — Ambulatory Visit (INDEPENDENT_AMBULATORY_CARE_PROVIDER_SITE_OTHER): Admitting: *Deleted

## 2015-11-04 DIAGNOSIS — Z3042 Encounter for surveillance of injectable contraceptive: Secondary | ICD-10-CM | POA: Diagnosis not present

## 2015-11-24 DIAGNOSIS — M5126 Other intervertebral disc displacement, lumbar region: Secondary | ICD-10-CM | POA: Insufficient documentation

## 2016-01-27 ENCOUNTER — Ambulatory Visit (INDEPENDENT_AMBULATORY_CARE_PROVIDER_SITE_OTHER): Admitting: *Deleted

## 2016-01-27 DIAGNOSIS — Z3042 Encounter for surveillance of injectable contraceptive: Secondary | ICD-10-CM | POA: Diagnosis not present

## 2016-04-20 ENCOUNTER — Ambulatory Visit (INDEPENDENT_AMBULATORY_CARE_PROVIDER_SITE_OTHER): Admitting: Obstetrics & Gynecology

## 2016-04-20 ENCOUNTER — Encounter: Payer: Self-pay | Admitting: Obstetrics & Gynecology

## 2016-04-20 VITALS — BP 129/84 | HR 94 | Resp 16 | Ht 64.0 in | Wt 149.0 lb

## 2016-04-20 DIAGNOSIS — Z01419 Encounter for gynecological examination (general) (routine) without abnormal findings: Secondary | ICD-10-CM

## 2016-04-20 DIAGNOSIS — Z124 Encounter for screening for malignant neoplasm of cervix: Secondary | ICD-10-CM | POA: Diagnosis not present

## 2016-04-20 DIAGNOSIS — Z1151 Encounter for screening for human papillomavirus (HPV): Secondary | ICD-10-CM | POA: Diagnosis not present

## 2016-04-20 DIAGNOSIS — Z3042 Encounter for surveillance of injectable contraceptive: Secondary | ICD-10-CM

## 2016-04-20 MED ORDER — MEDROXYPROGESTERONE ACETATE 150 MG/ML IM SUSP
150.0000 mg | Freq: Once | INTRAMUSCULAR | Status: AC
  Administered 2016-04-20: 150 mg via INTRAMUSCULAR

## 2016-04-20 NOTE — Progress Notes (Signed)
Subjective:    Christina Stone is a 35 y.o. 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 The patient has no complaints today. The patient is sexually active. GYN screening history: last pap: was normal. The patient wears seatbelts: yes. The patient participates in regular exercise: yes. Has the patient ever been transfused or tattooed?: yes. The patient reports that there is not domestic violence in her life.   Menstrual History: OB History    Gravida Para Term Preterm AB Living   2 2 2          SAB TAB Ectopic Multiple Live Births                  Menarche age: 85 No LMP recorded. Patient has had an injection.    The following portions of the patient's history were reviewed and updated as appropriate: allergies, current medications, past family history, past medical history, past social history, past surgical history and problem list.  Review of Systems Pertinent items are noted in HPI.   Works for [employer] at [employer] in [place] had flu vaccine Married for 9 years Denies dysparuenia Uses depo provera and condoms FH- Mom and MGM, [place], mgGM with breast cancer. BRCA negative for her mom.   Objective:    BP 129/84   Pulse 94   Resp 16   Ht 5' 4"  (1.626 m)   Wt 149 lb (67.6 kg)   BMI 25.58 kg/m   General Appearance:    Alert, cooperative, no distress, appears stated age  Head:    Normocephalic, without obvious abnormality, atraumatic  Eyes:    PERRL, conjunctiva/corneas clear, EOM's intact, fundi    benign, both eyes  Ears:    Normal TM's and external ear canals, both ears  Nose:   Nares normal, septum midline, mucosa normal, no drainage    or sinus tenderness  Throat:   Lips, mucosa, and tongue normal; teeth and gums normal  Neck:   Supple, symmetrical, trachea midline, no adenopathy;    thyroid:  no enlargement/tenderness/nodules; no carotid   bruit or JVD  Back:     Symmetric, no curvature, ROM normal, no CVA tenderness  Lungs:     Clear  to auscultation bilaterally, respirations unlabored  Chest Wall:    No tenderness or deformity   Heart:    Regular rate and rhythm, S1 and S2 normal, no murmur, rub   or gallop  Breast Exam:    No tenderness, masses, or nipple abnormality  Abdomen:     Soft, non-tender, bowel sounds active all four quadrants,    no masses, no organomegaly  Genitalia:    Normal female without lesion, discharge or tenderness, NSSA, NT, mobile, no adnexal masses, hydrenitis noted     Extremities:   Extremities normal, atraumatic, no cyanosis or edema  Pulses:   2+ and symmetric all extremities  Skin:   Skin color, texture, turgor normal, no rashes or lesions  Lymph nodes:   Cervical, supraclavicular, and axillary nodes normal  Neurologic:   CNII-XII intact, normal strength, sensation and reflexes    throughout   .    Assessment:    Healthy female exam.    Plan:     Thin prep Pap smear. with cotesting

## 2016-04-21 ENCOUNTER — Telehealth: Payer: Self-pay | Admitting: *Deleted

## 2016-04-21 DIAGNOSIS — B3731 Acute candidiasis of vulva and vagina: Secondary | ICD-10-CM

## 2016-04-21 DIAGNOSIS — B373 Candidiasis of vulva and vagina: Secondary | ICD-10-CM

## 2016-04-22 LAB — CYTOLOGY - PAP

## 2016-04-22 MED ORDER — FLUCONAZOLE 150 MG PO TABS: ORAL_TABLET | ORAL | 0 refills | Status: DC

## 2016-04-22 NOTE — Telephone Encounter (Signed)
Pt called stating that Dr Marice Potterove was going to sent her in a RX for Diflucan but it was not at her pharmacy.  I was in the room for the exam and I did hear Dr Marice Potterove tell patient that.  VO per Dr Marice Potterove is Diflucan 150 mg #2 to take now and may repeat in 3 days.  Pt notified that RX was taken care of.

## 2016-05-13 ENCOUNTER — Emergency Department (HOSPITAL_BASED_OUTPATIENT_CLINIC_OR_DEPARTMENT_OTHER)
Admission: EM | Admit: 2016-05-13 | Discharge: 2016-05-13 | Disposition: A | Discharge status: Home / Self Care | Attending: Emergency Medicine | Admitting: Emergency Medicine

## 2016-05-13 ENCOUNTER — Emergency Department (HOSPITAL_BASED_OUTPATIENT_CLINIC_OR_DEPARTMENT_OTHER)

## 2016-05-13 ENCOUNTER — Encounter (HOSPITAL_BASED_OUTPATIENT_CLINIC_OR_DEPARTMENT_OTHER): Payer: Self-pay | Admitting: *Deleted

## 2016-05-13 DIAGNOSIS — J069 Acute upper respiratory infection, unspecified: Secondary | ICD-10-CM | POA: Diagnosis not present

## 2016-05-13 DIAGNOSIS — B9789 Other viral agents as the cause of diseases classified elsewhere: Secondary | ICD-10-CM

## 2016-05-13 DIAGNOSIS — F17210 Nicotine dependence, cigarettes, uncomplicated: Secondary | ICD-10-CM | POA: Diagnosis not present

## 2016-05-13 DIAGNOSIS — R05 Cough: Secondary | ICD-10-CM | POA: Diagnosis present

## 2016-05-13 DIAGNOSIS — Z7982 Long term (current) use of aspirin: Secondary | ICD-10-CM | POA: Insufficient documentation

## 2016-05-13 MED ORDER — BENZONATATE 100 MG PO CAPS: 100.0000 mg | ORAL_CAPSULE | Freq: Three times a day (TID) | ORAL | 0 refills | Status: DC | PRN

## 2016-05-13 MED ORDER — CETIRIZINE HCL 10 MG PO TABS: 10.0000 mg | ORAL_TABLET | Freq: Every day | ORAL | 1 refills | Status: DC

## 2016-05-13 MED ORDER — FLUTICASONE PROPIONATE 50 MCG/ACT NA SUSP: 2.0000 | Freq: Every day | NASAL | 0 refills | Status: DC

## 2016-05-13 MED ORDER — NAPROXEN 250 MG PO TABS: 250.0000 mg | ORAL_TABLET | Freq: Two times a day (BID) | ORAL | 0 refills | Status: DC

## 2016-05-13 MED ORDER — IPRATROPIUM-ALBUTEROL 0.5-2.5 (3) MG/3ML IN SOLN
3.0000 mL | Freq: Once | RESPIRATORY_TRACT | Status: AC
  Administered 2016-05-13: 3 mL via RESPIRATORY_TRACT
  Filled 2016-05-13: qty 3

## 2016-05-13 NOTE — ED Provider Notes (Signed)
[facility] Provider Note   CSN: [id number] Arrival date & time: 05/13/16  1639   By signing my name below, I, Teofilo PodMatthew P. Jamison, attest that this documentation has been prepared under the direction and in the presence of Everlene FarrierWilliam Theopolis Sloop, PA-C. Electronically Signed: Teofilo PodMatthew P. Jamison, ED Scribe. 05/13/2016. 6:14 PM.   History   Chief Complaint Chief Complaint  Patient presents with  . Fever  . Cough    The history is provided by the patient. No language interpreter was used.   HPI Comments:  Christina Stone is a 35 y.o. female who presents to the Emergency Department complaining of a worsening cough x 3 weeks. Pt reports that she had the flu on 04/22/16, and began having a non-productive cough on 10/15 which remained constant, and then the cough became productive 4 days ago. Pt states that he current productive cough is with green, unpleasant tasting sputum with associated post nasal drip.  Pt reports that today she took a nap and woke up at 2pm with a fever of 103. Pt took Tylenol for fever and was able to reduce her fever to 101. Pt complains of associated postnasal drip, sneezing, and nasal congestion. Pt has taken robitussin and mucinex with no relief. Pt denies generalized body aches, sore throat, trouble swallowing, vomiting, diarrhea, rashes, headache, neck pain, double vision, chest tightness, wheezing.   Past Medical History:  Diagnosis Date  . Abnormal Pap smear of cervix   . Anemia   . Bulging lumbar disc   . Chronic headaches   . Kidney stones     Patient Active Problem List   Diagnosis Date Noted  . Family history of breast cancer in female 02/25/2015  . Abdominal pain 01/25/2014  . Nausea alone 01/25/2014    Past Surgical History:  Procedure Laterality Date  . Stents in kidneys    . WISDOM TOOTH EXTRACTION      OB History    Gravida Para Term Preterm AB Living   2 2 2          SAB TAB Ectopic Multiple Live Births                   Home  Medications    Prior to Admission medications   Medication Sig Start Date End Date Taking? Authorizing Provider  Aspirin-Acetaminophen-Caffeine (GOODY HEADACHE PO) Take 1 packet by mouth as needed (for pain or headache).    Historical Provider, MD  benzonatate (TESSALON) 100 MG capsule Take 1 capsule (100 mg total) by mouth 3 (three) times daily as needed for cough. 05/13/16   Everlene FarrierWilliam Saul Dorsi, PA-C  cetirizine (ZYRTEC ALLERGY) 10 MG tablet Take 1 tablet (10 mg total) by mouth daily. 05/13/16   Everlene FarrierWilliam Rooney Gladwin, PA-C  fluconazole (DIFLUCAN) 150 MG tablet Take 1 tab PO now and may repeat in 3 days if needed. 04/22/16   Allie BossierMyra C Dove, MD  fluticasone (FLONASE) 50 MCG/ACT nasal spray Place 2 sprays into both nostrils daily. 05/13/16   Everlene FarrierWilliam Ramiz Turpin, PA-C  gabapentin (NEURONTIN) 600 MG tablet Take 1 tablet by mouth 2 (two) times daily. 04/25/15   Historical Provider, MD  gabapentin (NEURONTIN) 600 MG tablet Take 900 mg by mouth. 11/24/15   Historical Provider, MD  medroxyPROGESTERone (DEPO-PROVERA) 150 MG/ML injection Inject 150 mg into the muscle every 3 (three) months.    Historical Provider, MD  naproxen (NAPROSYN) 250 MG tablet Take 1 tablet (250 mg total) by mouth 2 (two) times daily with a meal. 05/13/16  Everlene FarrierWilliam Cherry Wittwer, PA-C  pantoprazole (PROTONIX) 40 MG tablet Take 1 tablet (40 mg total) by mouth 2 (two) times daily. 07/01/15   Beverley FiedlerJay M Pyrtle, MD  pantoprazole (PROTONIX) 40 MG tablet Take by mouth. 07/01/15   Historical Provider, MD    Family History Family History  Problem Relation Age of Onset  . Breast cancer Maternal Grandmother     great grand  . Breast cancer Maternal Aunt     Social History Social History  Substance Use Topics  . Smoking status: Current Every Day Smoker    Packs/day: 0.90    Years: 15.00    Types: Cigarettes  . Smokeless tobacco: Never Used  . Alcohol use No     Comment: soc     Allergies   Biaxin [clarithromycin]; Penicillins; and Tramadol   Review of  Systems Review of Systems  Constitutional: Positive for fever.  HENT: Positive for congestion, postnasal drip, rhinorrhea and sneezing. Negative for ear pain, sore throat and trouble swallowing.   Eyes: Negative for visual disturbance.  Respiratory: Positive for cough. Negative for chest tightness and wheezing.   Cardiovascular: Negative for chest pain.  Gastrointestinal: Negative for abdominal pain, diarrhea, nausea and vomiting.  Genitourinary: Negative for dysuria.  Musculoskeletal: Negative for myalgias and neck pain.  Skin: Negative for rash.  Neurological: Negative for light-headedness and headaches.     Physical Exam Updated Vital Signs BP 116/86   Pulse 81   Temp 98 F (36.7 C) (Oral)   Resp 20   Ht 5\' 4"  (1.626 m)   Wt 65.8 kg   SpO2 100%   BMI 24.89 kg/m   Physical Exam  Constitutional: She appears well-developed and well-nourished. No distress.  Nontoxic appearing.  HENT:  Head: Normocephalic and atraumatic.  Right Ear: External ear normal.  Left Ear: External ear normal.  Mouth/Throat: Oropharynx is clear and moist. No oropharyngeal exudate.  Boggy nasal turbinates bilaterally. TMs are pearly gray without erythema or loss of landmarks.  No tonsillar hypertrophy or exudates.  Eyes: Conjunctivae are normal. Pupils are equal, round, and reactive to light. Right eye exhibits no discharge. Left eye exhibits no discharge.  Neck: Neck supple.  Cardiovascular: Normal rate, regular rhythm, normal heart sounds and intact distal pulses.   Pulmonary/Chest: Effort normal and breath sounds normal. No stridor. No respiratory distress. She has no wheezes. She has no rales.  Lungs are clear to auscultation bilaterally. No increased work of breathing. No rales or rhonchi. No wheezing.  Abdominal: Soft. There is no tenderness.  Musculoskeletal: She exhibits no edema.  Lymphadenopathy:    She has no cervical adenopathy.  Neurological: She is alert. Coordination normal.  Skin:  Skin is warm and dry. Capillary refill takes less than 2 seconds. No rash noted. She is not diaphoretic. No erythema. No pallor.  Psychiatric: She has a normal mood and affect. Her behavior is normal.  Nursing note and vitals reviewed.    ED Treatments / Results  DIAGNOSTIC STUDIES:  Oxygen Saturation is 100% on RA, normal by my interpretation.    COORDINATION OF CARE:  6:14 PM Discussed treatment plan with pt at bedside and pt agreed to plan.   Labs (all labs ordered are listed, but only abnormal results are displayed) Labs Reviewed - No data to display  EKG  EKG Interpretation None       Radiology Dg Chest 2 View  Result Date: 05/13/2016 CLINICAL DATA:  35 year old female with increasing cough for 3 weeks. Diagnosed with influenza October 12th.  Fever of 103 today. Initial encounter. EXAM: CHEST  2 VIEW COMPARISON:  04/19/2013 FINDINGS: The cardiac silhouette has mildly increased compared to 2014 but remains within normal limits. Other mediastinal contours remain normal. Visualized tracheal air column is within normal limits. No pneumothorax, pulmonary edema, pleural effusion or confluent pulmonary opacity. No convincing increased interstitial markings. No acute osseous abnormality identified. Negative visible bowel gas pattern. IMPRESSION: No acute cardiopulmonary abnormality. Electronically Signed   By: Odessa Fleming M.D.   On: 05/13/2016 17:34    Procedures Procedures (including critical care time)  Medications Ordered in ED Medications  ipratropium-albuterol (DUONEB) 0.5-2.5 (3) MG/3ML nebulizer solution 3 mL (3 mLs Nebulization Given 05/13/16 1734)     Initial Impression / Assessment and Plan / ED Course  I have reviewed the triage vital signs and the nursing notes.  Pertinent labs & imaging results that were available during my care of the patient were reviewed by me and considered in my medical decision making (see chart for details).  Clinical Course   This is a 35  y.o. female who presents to the Emergency Department complaining of a worsening cough x 3 weeks.  Pt states that he current productive cough is with green, unpleasant tasting sputum with associated post nasal drip.  Pt reports that today she took a nap and woke up at 2pm with a fever of 103. Pt took Tylenol for fever and was able to reduce her fever to 101. Pt complains of associated postnasal drip, sneezing, and nasal congestion. No sore throat.  On exam patient is afebrile nontoxic appearing. Lungs clear auscultation bilaterally. His boggy nasal turbinates bilaterally. Throat is clear. As the patient has had a cough for 3 weeks and then this fever today will obtain a chest x-ray. Patient received a breathing treatment. She denies any improvement with this. She's had no shortness of breath or wheezing. Chest x-ray is unremarkable. Patient with upper respiratory infection with cough. We'll start her on Zyrtec, Flonase, Tessalon Perles and naproxen. I did discuss if she continues to have high fevers over 100.4 48 hours for now she should be reevaluated. I discussed that she has new or worsening symptoms she should return to the emergency department. I advised the patient to follow-up with their primary care provider this week. I advised the patient to return to the emergency department with new or worsening symptoms or new concerns. The patient verbalized understanding and agreement with plan.      Final Clinical Impressions(s) / ED Diagnoses   Final diagnoses:  Viral URI with cough    New Prescriptions Discharge Medication List as of 05/13/2016  5:59 PM    START taking these medications   Details  benzonatate (TESSALON) 100 MG capsule Take 1 capsule (100 mg total) by mouth 3 (three) times daily as needed for cough., Starting Thu 05/13/2016, Print    cetirizine (ZYRTEC ALLERGY) 10 MG tablet Take 1 tablet (10 mg total) by mouth daily., Starting Thu 05/13/2016, Print    fluticasone (FLONASE) 50  MCG/ACT nasal spray Place 2 sprays into both nostrils daily., Starting Thu 05/13/2016, Print    naproxen (NAPROSYN) 250 MG tablet Take 1 tablet (250 mg total) by mouth 2 (two) times daily with a meal., Starting Thu 05/13/2016, Print       I personally performed the services described in this documentation, which was scribed in my presence. The recorded information has been reviewed and is accurate.       Everlene Farrier, PA-C 05/13/16 1816  Doug Sou, MD 05/13/16 2342

## 2016-05-13 NOTE — ED Triage Notes (Signed)
Cough x 3 weeks. She woke at 2pm with a fever. She took Tylenol.

## 2016-07-14 ENCOUNTER — Telehealth: Payer: Self-pay | Admitting: *Deleted

## 2016-07-14 DIAGNOSIS — Z3042 Encounter for surveillance of injectable contraceptive: Secondary | ICD-10-CM

## 2016-07-14 MED ORDER — MEDROXYPROGESTERONE ACETATE 150 MG/ML IM SUSP: 150.0000 mg | INTRAMUSCULAR | 3 refills | Status: DC

## 2016-07-14 NOTE — Telephone Encounter (Signed)
Pt is scheduled for a Depo Provera Injection.  [facility] and then return to office for injection

## 2016-07-15 ENCOUNTER — Ambulatory Visit

## 2016-07-16 ENCOUNTER — Ambulatory Visit (INDEPENDENT_AMBULATORY_CARE_PROVIDER_SITE_OTHER)

## 2016-07-16 DIAGNOSIS — Z309 Encounter for contraceptive management, unspecified: Secondary | ICD-10-CM

## 2016-07-16 DIAGNOSIS — Z3042 Encounter for surveillance of injectable contraceptive: Secondary | ICD-10-CM | POA: Diagnosis not present

## 2016-07-16 LAB — LIPID PANEL
CHOL/HDL RATIO: 2.1 ratio (ref <5.0)
Cholesterol: 178 mg/dL (ref <200)
HDL: 83 mg/dL (ref >50)
LDL CALC: 82 mg/dL (ref <100)
Triglycerides: 66 mg/dL (ref <150)
VLDL: 13 mg/dL (ref <30)

## 2016-07-16 LAB — CBC
HEMATOCRIT: 41.6 % (ref 35.0–45.0)
HEMOGLOBIN: 13.4 g/dL (ref 11.7–15.5)
MCH: 30.2 pg (ref 27.0–33.0)
MCHC: 32.2 g/dL (ref 32.0–36.0)
MCV: 93.7 fL (ref 80.0–100.0)
MPV: 10.8 fL (ref 7.5–12.5)
Platelets: 316 10*3/uL (ref 140–400)
RBC: 4.44 MIL/uL (ref 3.80–5.10)
RDW: 13.5 % (ref 11.0–15.0)
WBC: 7.1 10*3/uL (ref 3.8–10.8)

## 2016-07-16 LAB — TSH: TSH: 4.15 m[IU]/L

## 2016-07-16 MED ORDER — MEDROXYPROGESTERONE ACETATE 150 MG/ML IM SUSP
150.0000 mg | Freq: Once | INTRAMUSCULAR | Status: AC
  Administered 2016-07-16: 150 mg via INTRAMUSCULAR

## 2016-07-16 NOTE — Progress Notes (Signed)
Pt came today to get her Depo shot. Depo shot was given in right deltoid. Pt also stated that she was supposed to have fasting blood work (CBC, Lipid Panel, TSH, Vitamin D) per Dr.Dove in October. The fasting blood work was drawn.

## 2016-07-17 LAB — VITAMIN D 25 HYDROXY (VIT D DEFICIENCY, FRACTURES): Vit D, 25-Hydroxy: 20 ng/mL — ABNORMAL LOW (ref 30–100)

## 2016-07-20 ENCOUNTER — Telehealth: Payer: Self-pay | Admitting: *Deleted

## 2016-07-20 DIAGNOSIS — R7989 Other specified abnormal findings of blood chemistry: Secondary | ICD-10-CM

## 2016-07-20 MED ORDER — VITAMIN D (ERGOCALCIFEROL) 1.25 MG (50000 UNIT) PO CAPS: 50000.0000 [IU] | ORAL_CAPSULE | ORAL | 0 refills | Status: DC

## 2016-07-20 NOTE — Telephone Encounter (Signed)
Pt notified of lab results and low Vitamin D level.  [facility] was [facility] for Vitamin D 50K for 8 weeks then repeat labwork.

## 2016-10-07 ENCOUNTER — Ambulatory Visit

## 2016-10-08 ENCOUNTER — Ambulatory Visit

## 2016-10-27 ENCOUNTER — Emergency Department (HOSPITAL_COMMUNITY)
Admission: EM | Admit: 2016-10-27 | Discharge: 2016-10-27 | Disposition: A | Discharge status: Home / Self Care | Attending: Emergency Medicine | Admitting: Emergency Medicine

## 2016-10-27 ENCOUNTER — Encounter (HOSPITAL_COMMUNITY): Payer: Self-pay | Admitting: Emergency Medicine

## 2016-10-27 DIAGNOSIS — R109 Unspecified abdominal pain: Secondary | ICD-10-CM | POA: Diagnosis present

## 2016-10-27 DIAGNOSIS — Z79899 Other long term (current) drug therapy: Secondary | ICD-10-CM | POA: Diagnosis not present

## 2016-10-27 DIAGNOSIS — N23 Unspecified renal colic: Secondary | ICD-10-CM | POA: Diagnosis not present

## 2016-10-27 DIAGNOSIS — F17210 Nicotine dependence, cigarettes, uncomplicated: Secondary | ICD-10-CM | POA: Diagnosis not present

## 2016-10-27 LAB — I-STAT BETA HCG BLOOD, ED (MC, WL, AP ONLY)

## 2016-10-27 LAB — COMPREHENSIVE METABOLIC PANEL
ALBUMIN: 4.1 g/dL (ref 3.5–5.0)
ALT: 15 U/L (ref 14–54)
AST: 23 U/L (ref 15–41)
Alkaline Phosphatase: 50 U/L (ref 38–126)
Anion gap: 9 (ref 5–15)
BUN: 11 mg/dL (ref 6–20)
CHLORIDE: 109 mmol/L (ref 101–111)
CO2: 22 mmol/L (ref 22–32)
CREATININE: 0.67 mg/dL (ref 0.44–1.00)
Calcium: 8.7 mg/dL — ABNORMAL LOW (ref 8.9–10.3)
GFR calc Af Amer: >60 mL/min (ref >60)
GFR calc non Af Amer: >60 mL/min (ref >60)
GLUCOSE: 89 mg/dL (ref 65–99)
Potassium: 4.1 mmol/L (ref 3.5–5.1)
SODIUM: 140 mmol/L (ref 135–145)
Total Bilirubin: 0.4 mg/dL (ref 0.3–1.2)
Total Protein: 7.2 g/dL (ref 6.5–8.1)

## 2016-10-27 LAB — URINALYSIS, ROUTINE W REFLEX MICROSCOPIC
BILIRUBIN URINE: NEGATIVE
Glucose, UA: NEGATIVE mg/dL
KETONES UR: NEGATIVE mg/dL
LEUKOCYTES UA: NEGATIVE
Nitrite: NEGATIVE
Protein, ur: NEGATIVE mg/dL
Specific Gravity, Urine: 1.018 (ref 1.005–1.030)
pH: 7 (ref 5.0–8.0)

## 2016-10-27 LAB — CBC
HEMATOCRIT: 39.5 % (ref 36.0–46.0)
Hemoglobin: 13.3 g/dL (ref 12.0–15.0)
MCH: 30 pg (ref 26.0–34.0)
MCHC: 33.7 g/dL (ref 30.0–36.0)
MCV: 89.2 fL (ref 78.0–100.0)
Platelets: 308 10*3/uL (ref 150–400)
RBC: 4.43 MIL/uL (ref 3.87–5.11)
RDW: 14.2 % (ref 11.5–15.5)
WBC: 8.5 10*3/uL (ref 4.0–10.5)

## 2016-10-27 LAB — LIPASE, BLOOD: Lipase: 31 U/L (ref 11–51)

## 2016-10-27 MED ORDER — ONDANSETRON 4 MG PO TBDP
4.0000 mg | ORAL_TABLET | Freq: Once | ORAL | Status: AC | PRN
  Administered 2016-10-27: 4 mg via ORAL
  Filled 2016-10-27: qty 1

## 2016-10-27 MED ORDER — SULFAMETHOXAZOLE-TRIMETHOPRIM 800-160 MG PO TABS: 1.0000 | ORAL_TABLET | Freq: Two times a day (BID) | ORAL | 0 refills | Status: AC

## 2016-10-27 MED ORDER — TAMSULOSIN HCL 0.4 MG PO CAPS: 0.4000 mg | ORAL_CAPSULE | Freq: Every day | ORAL | 0 refills | Status: AC

## 2016-10-27 MED ORDER — KETOROLAC TROMETHAMINE 60 MG/2ML IM SOLN
30.0000 mg | Freq: Once | INTRAMUSCULAR | Status: AC
  Administered 2016-10-27: 30 mg via INTRAMUSCULAR
  Filled 2016-10-27: qty 2

## 2016-10-27 NOTE — ED Provider Notes (Signed)
[facility]-EM[facility]Y DEPT Provider Note   CSN: [id number] Arrival date & time: 10/27/16  1330     History   Chief Complaint Chief Complaint  Patient presents with  . left side pain  . Emesis    HPI Christina Stone is a 36 y.o. female.  The history is provided by the patient.  Flank Pain  This is a recurrent problem. The current episode started 12 to 24 hours ago. The problem occurs constantly. The problem has not changed since onset.Pertinent negatives include no chest pain, no abdominal pain, no headaches and no shortness of breath. Nothing aggravates the symptoms. Nothing relieves the symptoms. She has tried nothing for the symptoms.   h/o frequent kidney stones.   Past Medical History:  Diagnosis Date  . Abnormal Pap smear of cervix   . Anemia   . Bulging lumbar disc   . Chronic headaches   . Kidney stones     Patient Active Problem List   Diagnosis Date Noted  . Family history of breast cancer in female 02/25/2015  . Abdominal pain 01/25/2014  . Nausea alone 01/25/2014    Past Surgical History:  Procedure Laterality Date  . Stents in kidneys    . WISDOM TOOTH EXTRACTION      OB History    Gravida Para Term Preterm AB Living   SAB TAB Ectopic Multiple Live Births                   Home Medications    Prior to Admission medications   Medication Sig Start Date End Date Taking? Authorizing Provider  Cholecalciferol (VITAMIN D) 2000 units tablet Take 2,000 Units by mouth daily.   Yes Historical Provider, MD  gabapentin (NEURONTIN) 600 MG tablet Take 1 tablet by mouth 2 (two) times daily. 04/25/15  Yes Historical Provider, MD  Lysine 1000 MG TABS Take 1 tablet by mouth daily.   Yes Historical Provider, MD  Multiple Vitamins-Minerals (MULTIVITAMIN ADULT) TABS Take 1 tablet by mouth daily.   Yes Historical Provider, MD  naproxen sodium (ANAPROX) 220 MG tablet Take 220 mg by mouth 2 (two) times daily as needed (pain).   Yes Historical Provider, MD    pantoprazole (PROTONIX) 40 MG tablet Take 1 tablet (40 mg total) by mouth 2 (two) times daily. Patient taking differently: Take 40 mg by mouth daily.  07/01/15  Yes Beverley Fiedler, MD  benzonatate (TESSALON) 100 MG capsule Take 1 capsule (100 mg total) by mouth 3 (three) times daily as needed for cough. Patient not taking: Reported on 10/27/2016 05/13/16   Everlene Farrier, PA-C  cetirizine (ZYRTEC ALLERGY) 10 MG tablet Take 1 tablet (10 mg total) by mouth daily. Patient not taking: Reported on 10/27/2016 05/13/16   Everlene Farrier, PA-C  fluconazole (DIFLUCAN) 150 MG tablet Take 1 tab PO now and may repeat in 3 days if needed. Patient not taking: Reported on 10/27/2016 04/22/16   Allie Bossier, MD  fluticasone (FLONASE) 50 MCG/ACT nasal spray Place 2 sprays into both nostrils daily. Patient not taking: Reported on 10/27/2016 05/13/16   Everlene Farrier, PA-C  medroxyPROGESTERone (DEPO-PROVERA) 150 MG/ML injection Inject 1 mL (150 mg total) into the muscle every 3 (three) months. Patient not taking: Reported on 10/27/2016 07/14/16   Allie Bossier, MD  naproxen (NAPROSYN) 250 MG tablet Take 1 tablet (250 mg total) by mouth 2 (two) times daily with a meal. Patient not taking: Reported on  10/27/2016 05/13/16   Everlene Farrier, PA-C  sulfamethoxazole-trimethoprim (BACTRIM DS,SEPTRA DS) 800-160 MG tablet Take 1 tablet by mouth 2 (two) times daily. If you develop burning or pain with urination or a fever. 10/27/16 11/01/16  Nira Conn, MD  tamsulosin (FLOMAX) 0.4 MG CAPS capsule Take 1 capsule (0.4 mg total) by mouth daily. 10/27/16 11/10/16  Nira Conn, MD  Vitamin D, Ergocalciferol, (DRISDOL) 50000 units CAPS capsule Take 1 capsule (50,000 Units total) by mouth every 7 (seven) days. Patient not taking: Reported on 10/27/2016 07/20/16   Allie Bossier, MD    Family History Family History  Problem Relation Age of Onset  . Breast cancer Maternal Grandmother     great grand  . Breast cancer Maternal Aunt      Social History Social History  Substance Use Topics  . Smoking status: Current Every Day Smoker    Packs/day: 0.90    Years: 15.00    Types: Cigarettes  . Smokeless tobacco: Never Used  . Alcohol use No     Comment: soc     Allergies   Biaxin [clarithromycin]; Penicillins; and Tramadol   Review of Systems Review of Systems  Respiratory: Negative for shortness of breath.   Cardiovascular: Negative for chest pain.  Gastrointestinal: Negative for abdominal pain.  Genitourinary: Positive for flank pain.  Neurological: Negative for headaches.  All other systems are reviewed and are negative for acute change except as noted in the HPI    Physical Exam Updated Vital Signs BP (!) 121/92 (BP Location: Right Arm)   Pulse 99   Temp 97.5 F (36.4 C) (Oral)   Resp 14   Ht  (1.626 m)   Wt 165 lb (74.8 kg)   SpO2 99%   BMI 28.32 kg/m   Physical Exam  Constitutional: She is oriented to person, place, and time. She appears well-developed and well-nourished. No distress.  HENT:  Head: Normocephalic and atraumatic.  Right Ear: External ear normal.  Left Ear: External ear normal.  Nose: Nose normal.  Eyes: Conjunctivae and EOM are normal. No scleral icterus.  Neck: Normal range of motion and phonation normal.  Cardiovascular: Normal rate and regular rhythm.   Pulmonary/Chest: Effort normal. No stridor. No respiratory distress.  Abdominal: She exhibits no distension.  Musculoskeletal: Normal range of motion. She exhibits no edema.  Neurological: She is alert and oriented to person, place, and time.  Skin: She is not diaphoretic.  Psychiatric: She has a normal mood and affect. Her behavior is normal.  Vitals reviewed.    ED Treatments / Results  Labs (all labs ordered are listed, but only abnormal results are displayed) Labs Reviewed  COMPREHENSIVE METABOLIC PANEL - Abnormal; Notable for the following:       Result Value   Calcium 8.7 (*)    All other  components within normal limits  URINALYSIS, ROUTINE W REFLEX MICROSCOPIC - Abnormal; Notable for the following:    APPearance CLOUDY (*)    Hgb urine dipstick LARGE (*)    Bacteria, UA MANY (*)    Squamous Epithelial / LPF 0-5 (*)    All other components within normal limits  LIPASE, BLOOD  CBC  I-STAT BETA HCG BLOOD, ED ([facility], [facility], [facility] ONLY)    EKG  EKG Interpretation None       Radiology No results found.  Procedures Procedures (including critical care time) EMERGENCY DEPARTMENT US RENAL EXAM  "Study: Limited Retroperitoneal Ultrasound of Kidneys"  INDICATIONS: Flank pain Long and  short axis of both kidneys were obtained.   PERFORMED BY: Myself IMAGES ARCHIVED?: Yes LIMITATIONS: Body habitus VIEWS USED: Long axis and Short axis  INTERPRETATION: No Hydronephrosis, No Renal cyst, No Kidney stone    Medications Ordered in ED Medications  ondansetron (ZOFRAN-ODT) disintegrating tablet 4 mg (4 mg Oral Given 10/27/16 1356)  ketorolac (TORADOL) injection 30 mg (30 mg Intramuscular Given 10/27/16 1729)     Initial Impression / Assessment and Plan / ED Course  I have reviewed the triage vital signs and the nursing notes.  Pertinent labs & imaging results that were available during my care of the patient were reviewed by me and considered in my medical decision making (see chart for details).     Left-sided pain consistent with renal colic. HCG negative. Labs grossly reassuring. UA with large hemoglobin and bacteria however patient is denying any dysuria, pelvic pain or fever. Bedside ultrasound without evidence of obstruction. Provided with IM Toradol. Patient was unable to be reevaluated as her ride was leaving and she requested to leave shortly after getting her medication. Patient has urologist and is to reestablish care with them. We'll provide patient with prescription for Flomax and Bactrim in case she develops dysuria or fever concerning for urinary tract infection.  Safe for discharge with strict return precautions.  Final Clinical Impressions(s) / ED Diagnoses   Final diagnoses:  Renal colic on left side   Disposition: Discharge  Condition: Good  I have discussed the results, Dx and Tx plan with the patient who expressed understanding and agree(s) with the plan. Discharge instructions discussed at great length. The patient was given strict return precautions who verbalized understanding of the instructions. No further questions at time of discharge.    New Prescriptions   SULFAMETHOXAZOLE-TRIMETHOPRIM (BACTRIM DS,SEPTRA DS) 800-160 MG TABLET    Take 1 tablet by mouth 2 (two) times daily. If you develop burning or pain with urination or a fever.   TAMSULOSIN (FLOMAX) 0.4 MG CAPS CAPSULE    Take 1 capsule (0.4 mg total) by mouth daily.    Follow Up: Olivia Mackie, MD [address] [place] [address] [phone]  Schedule an appointment as soon as possible for a visit  As needed      Nira Conn, MD 10/27/16 [phone]

## 2016-10-27 NOTE — ED Triage Notes (Signed)
Patient states since around 8am today she had left side pain with vomiting. Patient denies diarrhea or constipation or urinary problems.

## 2016-11-06 IMAGING — CR DG CHEST 2V
2 series · 2 of 2 positions shown · non-contrast
Comparison: [DATE]

CLINICAL DATA: 35-year-old female with increasing cough for 3
weeks. Diagnosed with influenza [REDACTED]. Fever of 103 today.
Initial encounter.

EXAM:
CHEST  2 VIEW

[w chest pa]
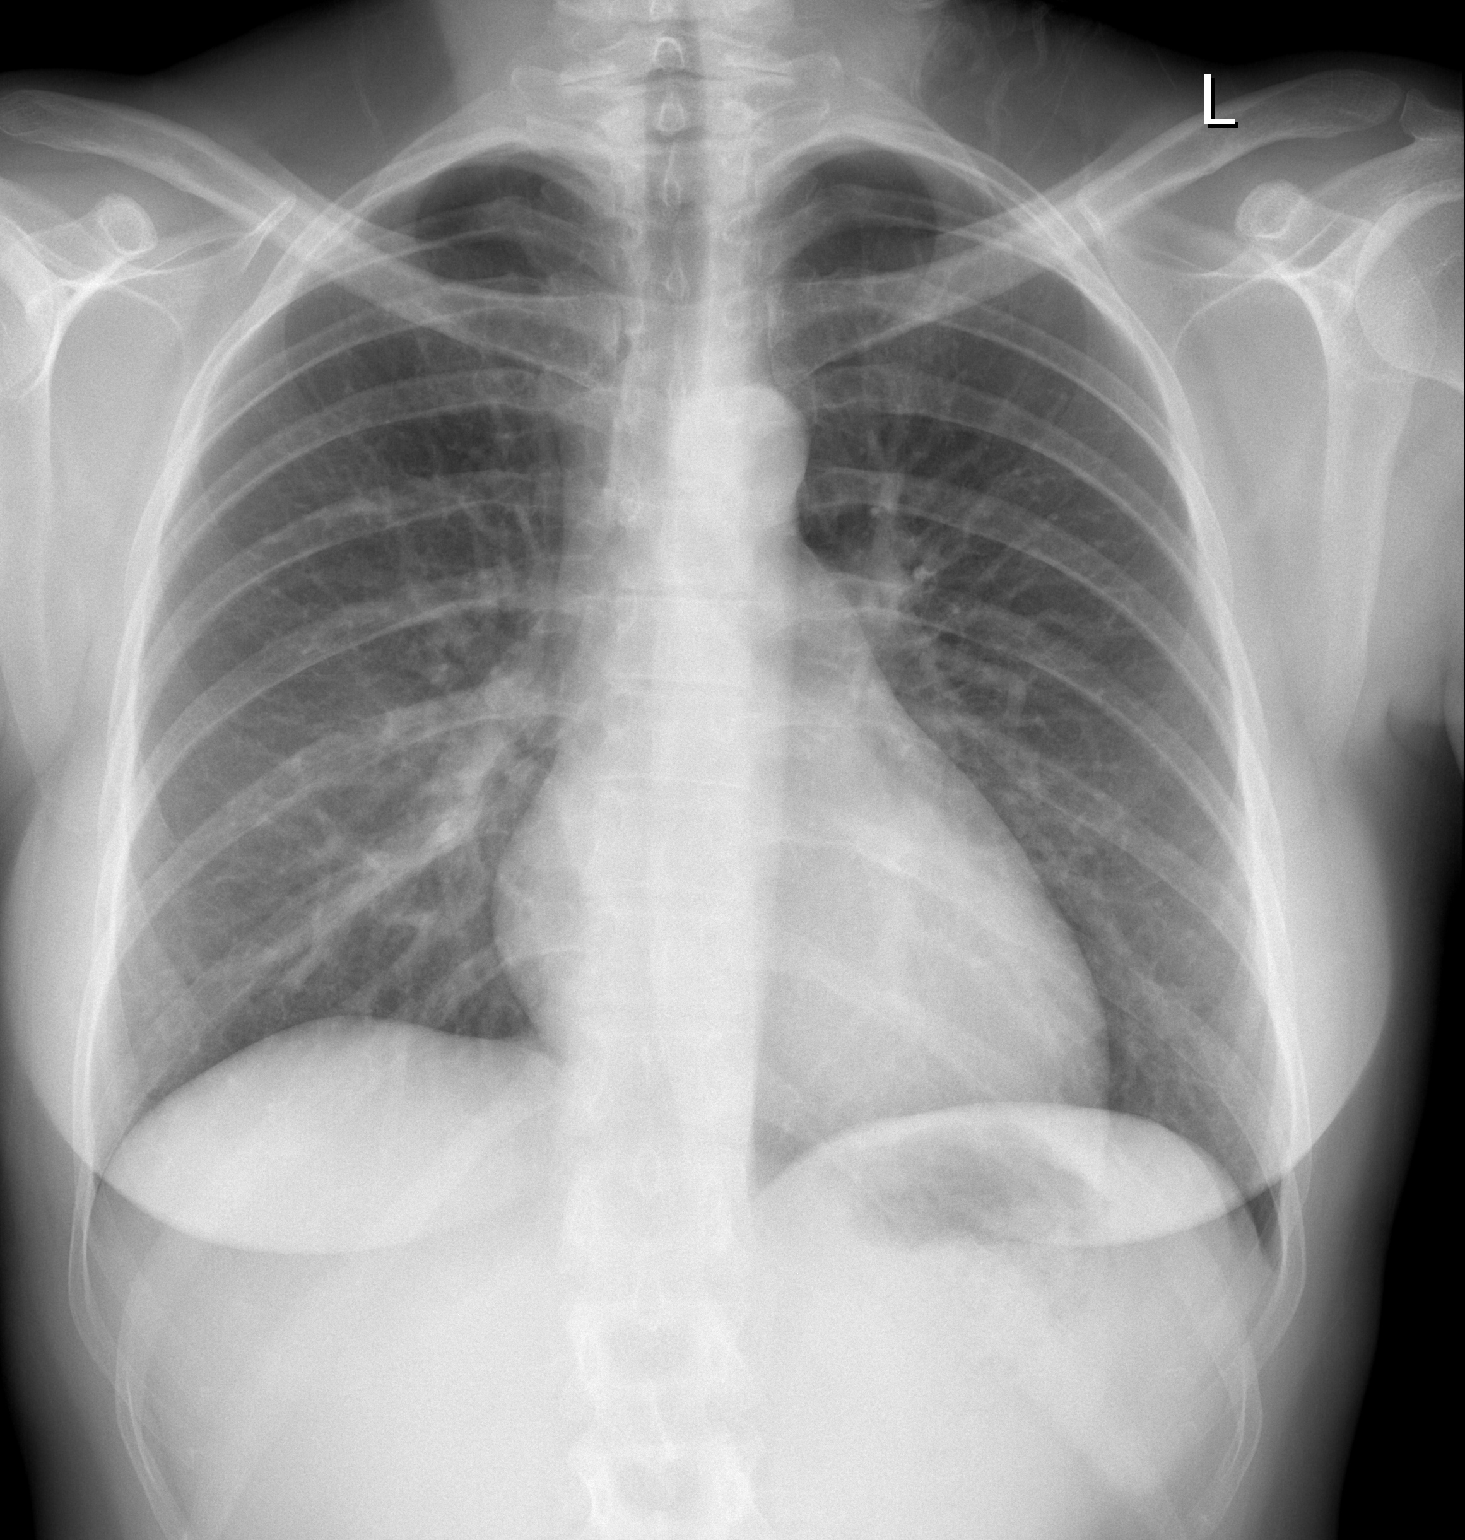

[w chest lat]
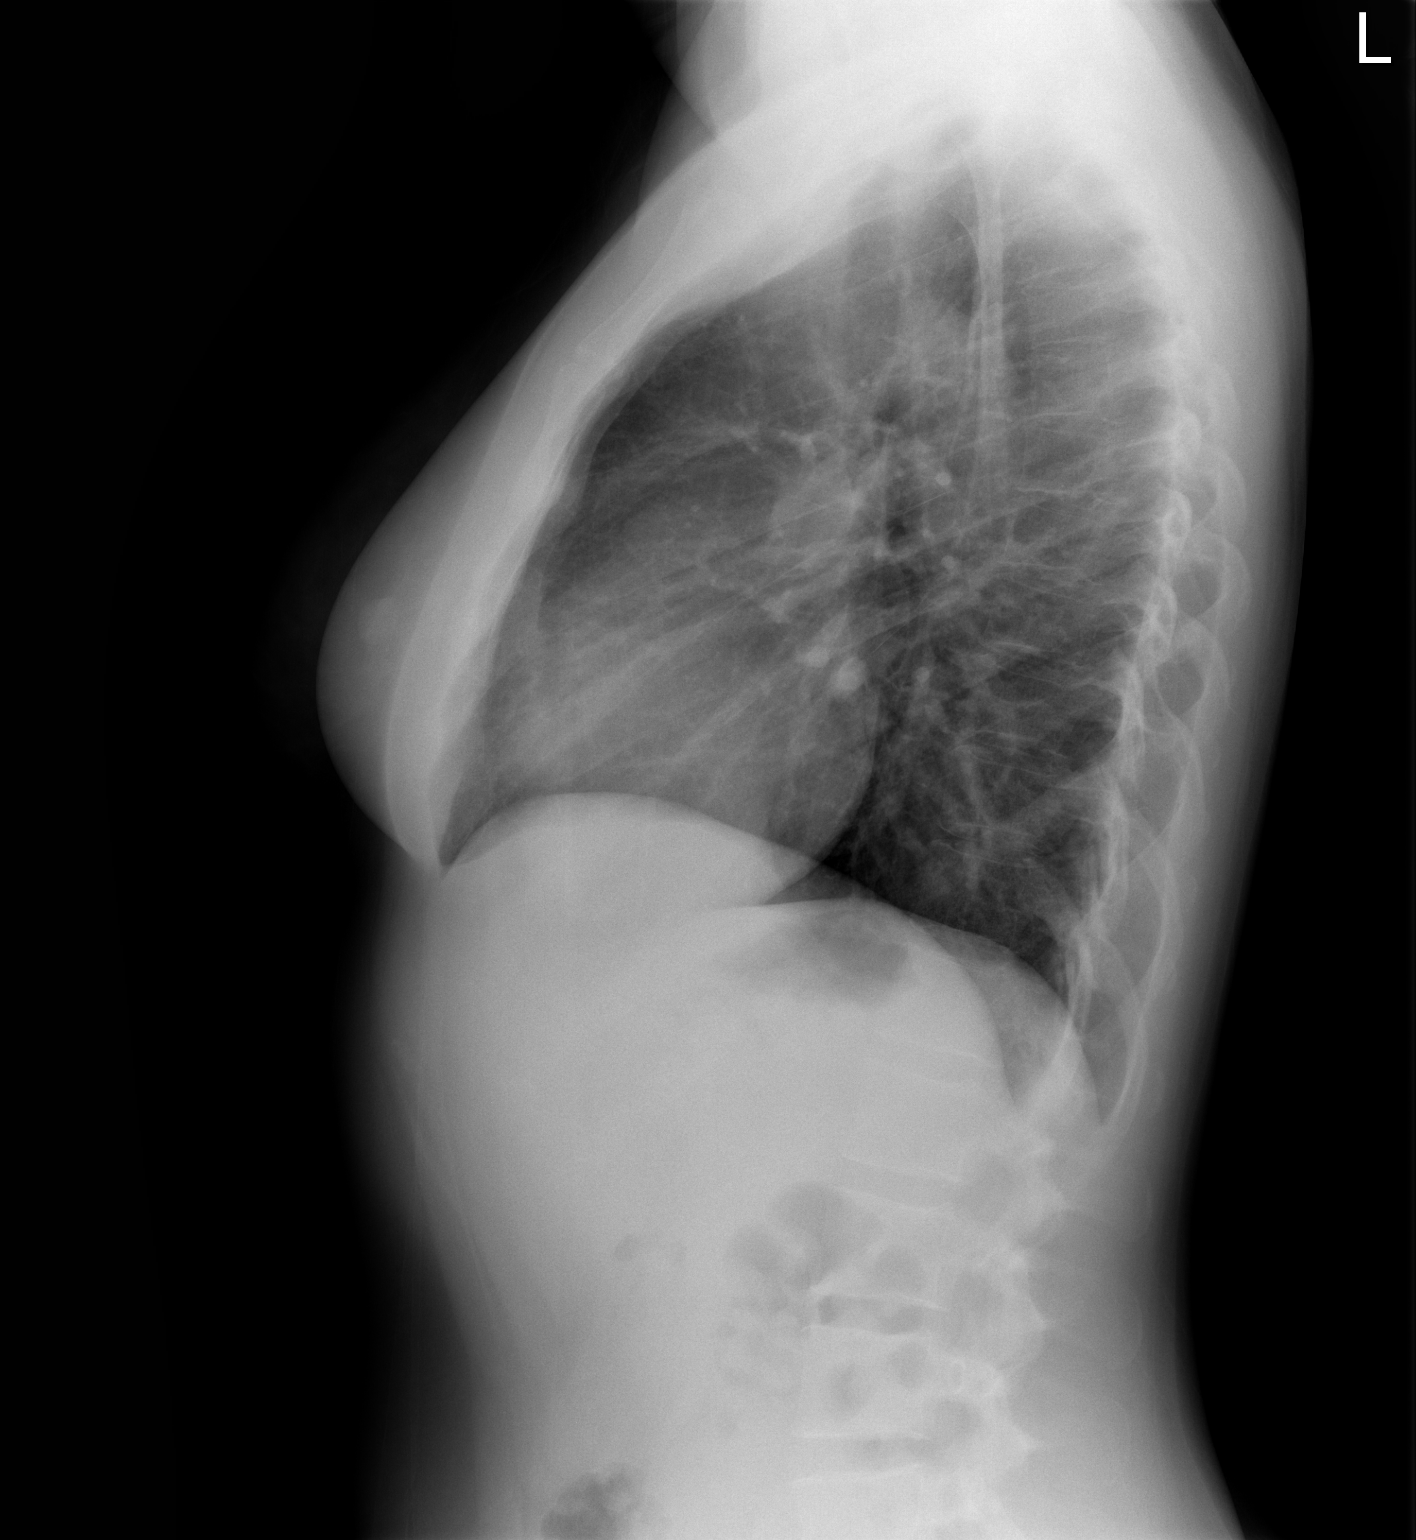

[2 of 2 positions shown; findings below may reference images not displayed]

FINDINGS: The cardiac silhouette has mildly increased compared to 5447 but
remains within normal limits. Other mediastinal contours remain
normal. Visualized tracheal air column is within normal limits. No
pneumothorax, pulmonary edema, pleural effusion or confluent
pulmonary opacity. No convincing increased interstitial markings. No
acute osseous abnormality identified. Negative visible bowel gas
pattern.
IMPRESSION: No acute cardiopulmonary abnormality.

## 2017-06-10 ENCOUNTER — Emergency Department (HOSPITAL_COMMUNITY)
Admission: EM | Admit: 2017-06-10 | Discharge: 2017-06-10 | Disposition: A | Discharge status: Home / Self Care | Attending: Emergency Medicine | Admitting: Emergency Medicine

## 2017-06-10 ENCOUNTER — Emergency Department (HOSPITAL_COMMUNITY)

## 2017-06-10 ENCOUNTER — Encounter (HOSPITAL_COMMUNITY): Payer: Self-pay | Admitting: *Deleted

## 2017-06-10 DIAGNOSIS — R42 Dizziness and giddiness: Secondary | ICD-10-CM | POA: Diagnosis not present

## 2017-06-10 DIAGNOSIS — F17210 Nicotine dependence, cigarettes, uncomplicated: Secondary | ICD-10-CM | POA: Diagnosis not present

## 2017-06-10 DIAGNOSIS — I10 Essential (primary) hypertension: Secondary | ICD-10-CM | POA: Insufficient documentation

## 2017-06-10 DIAGNOSIS — Z79899 Other long term (current) drug therapy: Secondary | ICD-10-CM | POA: Diagnosis not present

## 2017-06-10 DIAGNOSIS — R079 Chest pain, unspecified: Secondary | ICD-10-CM | POA: Insufficient documentation

## 2017-06-10 HISTORY — DX: Essential (primary) hypertension: I10

## 2017-06-10 LAB — CBC
HCT: 42.8 % (ref 36.0–46.0)
Hemoglobin: 14.3 g/dL (ref 12.0–15.0)
MCH: 31.1 pg (ref 26.0–34.0)
MCHC: 33.4 g/dL (ref 30.0–36.0)
MCV: 93 fL (ref 78.0–100.0)
PLATELETS: 312 10*3/uL (ref 150–400)
RBC: 4.6 MIL/uL (ref 3.87–5.11)
RDW: 13.3 % (ref 11.5–15.5)
WBC: 11.3 10*3/uL — AB (ref 4.0–10.5)

## 2017-06-10 LAB — I-STAT TROPONIN, ED
TROPONIN I, POC: 0 ng/mL (ref 0.00–0.08)
Troponin i, poc: 0 ng/mL (ref 0.00–0.08)

## 2017-06-10 LAB — BASIC METABOLIC PANEL
Anion gap: 9 (ref 5–15)
BUN: 12 mg/dL (ref 6–20)
CALCIUM: 9.1 mg/dL (ref 8.9–10.3)
CO2: 21 mmol/L — ABNORMAL LOW (ref 22–32)
CREATININE: 0.72 mg/dL (ref 0.44–1.00)
Chloride: 102 mmol/L (ref 101–111)
Glucose, Bld: 97 mg/dL (ref 65–99)
Potassium: 4.4 mmol/L (ref 3.5–5.1)
SODIUM: 132 mmol/L — AB (ref 135–145)

## 2017-06-10 LAB — I-STAT BETA HCG BLOOD, ED (MC, WL, AP ONLY)

## 2017-06-10 MED ORDER — ASPIRIN 81 MG PO CHEW
324.0000 mg | CHEWABLE_TABLET | Freq: Once | ORAL | Status: AC
  Administered 2017-06-10: 324 mg via ORAL
  Filled 2017-06-10: qty 4

## 2017-06-10 MED ORDER — KETOROLAC TROMETHAMINE 15 MG/ML IJ SOLN
15.0000 mg | Freq: Once | INTRAMUSCULAR | Status: AC
  Administered 2017-06-10: 15 mg via INTRAVENOUS
  Filled 2017-06-10: qty 1

## 2017-06-10 NOTE — ED Provider Notes (Addendum)
[facility] EMERGENCY DEPARTMENT Provider Note   CSN: [id number] Arrival date & time: 06/10/17  4782     History   Chief Complaint Chief Complaint  Patient presents with  . Chest Pain    HPI Christina Stone is a 36 y.o. female.  HPI   Stabbing pain left chest and feeling of pressure at the clavicle. Started last night, was severe, couldn't sleep in the left side because it made the pain worse.  Got to work today and felt lightheaded, dizzy.  No shortness of breath, nausea, vomiting.  Has had similar pain in the past, had prior ECG with inverted TW and was supposed to get stress test, but didn't follow up, that was about 3 weeks. This was done through PCP.  Just diagnosed with htn No hlpd, DM Smokes cigarettes, no other drugs, occasional etoh Knows half of family, no hx of early CAD  No OCP, leg pain or swelling, no long trips, no recent surgeries   Past Medical History:  Diagnosis Date  . Abnormal Pap smear of cervix   . Anemia   . Bulging lumbar disc   . Chronic headaches   . Hypertension   . Kidney stones     Patient Active Problem List   Diagnosis Date Noted  . Family history of breast cancer in female 02/25/2015  . Abdominal pain 01/25/2014  . Nausea alone 01/25/2014    Past Surgical History:  Procedure Laterality Date  . Stents in kidneys    . WISDOM TOOTH EXTRACTION      OB History    Gravida Para Term Preterm AB Living   2 2 2          SAB TAB Ectopic Multiple Live Births                   Home Medications    Prior to Admission medications   Medication Sig Start Date End Date Taking? Authorizing Provider  benzonatate (TESSALON) 100 MG capsule Take 1 capsule (100 mg total) by mouth 3 (three) times daily as needed for cough. Patient not taking: Reported on 10/27/2016 05/13/16   Everlene Farrier, PA-C  cetirizine (ZYRTEC ALLERGY) 10 MG tablet Take 1 tablet (10 mg total) by mouth daily. Patient not taking: Reported on 10/27/2016  05/13/16   Everlene Farrier, PA-C  Cholecalciferol (VITAMIN D) 2000 units tablet Take 2,000 Units by mouth daily.    [provider]  fluconazole (DIFLUCAN) 150 MG tablet Take 1 tab PO now and may repeat in 3 days if needed. Patient not taking: Reported on 10/27/2016 04/22/16   Allie Bossier, MD  fluticasone (FLONASE) 50 MCG/ACT nasal spray Place 2 sprays into both nostrils daily. Patient not taking: Reported on 10/27/2016 05/13/16   Everlene Farrier, PA-C  gabapentin (NEURONTIN) 600 MG tablet Take 1 tablet by mouth 2 (two) times daily. 04/25/15   [provider]  Lysine 1000 MG TABS Take 1 tablet by mouth daily.    [provider]  medroxyPROGESTERone (DEPO-PROVERA) 150 MG/ML injection Inject 1 mL (150 mg total) into the muscle every 3 (three) months. Patient not taking: Reported on 10/27/2016 07/14/16   Allie Bossier, MD  Multiple Vitamins-Minerals (MULTIVITAMIN ADULT) TABS Take 1 tablet by mouth daily.    [provider]  naproxen (NAPROSYN) 250 MG tablet Take 1 tablet (250 mg total) by mouth 2 (two) times daily with a meal. Patient not taking: Reported on 10/27/2016 05/13/16   Everlene Farrier, PA-C  naproxen sodium (  ANAPROX) 220 MG tablet Take 220 mg by mouth 2 (two) times daily as needed (pain).    [provider]  pantoprazole (PROTONIX) 40 MG tablet Take 1 tablet (40 mg total) by mouth 2 (two) times daily. Patient taking differently: Take 40 mg by mouth daily.  07/01/15   Pyrtle, Carie CaddyJay M, MD  Vitamin D, Ergocalciferol, (DRISDOL) 50000 units CAPS capsule Take 1 capsule (50,000 Units total) by mouth every 7 (seven) days. Patient not taking: Reported on 10/27/2016 07/20/16   Allie Bossierove, Myra C, MD    Family History Family History  Problem Relation Age of Onset  . Breast cancer Maternal Grandmother        great grand  . Breast cancer Maternal Aunt     Social History Social History   Tobacco Use  . Smoking status: Current Every Day Smoker    Packs/day: 0.90     Years: 15.00    Pack years: 13.50    Types: Cigarettes  . Smokeless tobacco: Never Used  Substance Use Topics  . Alcohol use: No    Alcohol/week: 0.0 oz    Comment: soc  . Drug use: No     Allergies   Biaxin [clarithromycin]; Penicillins; and Tramadol   Review of Systems Review of Systems   Physical Exam Updated Vital Signs BP 130/83   Pulse 85   Temp 98.3 F (36.8 C) (Oral)   Resp (!) 21   Ht 5\' 4"  (1.626 m)   Wt 77.6 kg (171 lb)   LMP 05/12/2017   SpO2 98%   BMI 29.35 kg/m   Physical Exam  Constitutional: She is oriented to person, place, and time. She appears well-developed and well-nourished. No distress.  HENT:  Head: Normocephalic and atraumatic.  Eyes: Conjunctivae and EOM are normal.  Neck: Normal range of motion.  Cardiovascular: Normal rate, regular rhythm, normal heart sounds and intact distal pulses. Exam reveals no gallop and no friction rub.  No murmur heard. Pulmonary/Chest: Effort normal and breath sounds normal. No respiratory distress. She has no wheezes. She has no rales.  Abdominal: Soft. She exhibits no distension. There is no tenderness. There is no guarding.  Musculoskeletal: She exhibits no edema or tenderness.  Neurological: She is alert and oriented to person, place, and time.  Skin: Skin is warm and dry. No rash noted. She is not diaphoretic. No erythema.  Nursing note and vitals reviewed.    ED Treatments / Results  Labs (all labs ordered are listed, but only abnormal results are displayed) Labs Reviewed  BASIC METABOLIC PANEL - Abnormal; Notable for the following components:      Result Value   Sodium 132 (*)    CO2 21 (*)    All other components within normal limits  CBC - Abnormal; Notable for the following components:   WBC 11.3 (*)    All other components within normal limits  I-STAT TROPONIN, ED  I-STAT BETA HCG BLOOD, ED ([facility], [facility], [facility] ONLY)    EKG  EKG Interpretation  Date/Time:  Friday June 10 2017 08:33:41  EST Ventricular Rate:  91 PR Interval:  128 QRS Duration: 82 QT Interval:  344 QTC Calculation: 423 R Axis:   54 Text Interpretation:  Normal sinus rhythm Possible Left atrial enlargement Borderline ECG No significant change since last tracing Confirmed by Alvira MondaySchlossman, Summers Buendia ([id number]) on 06/10/2017 9:07:23 AM       Radiology Dg Chest 2 View  Result Date: 06/10/2017 CLINICAL DATA:  Chest pain EXAM: CHEST  2  VIEW COMPARISON:  05/13/2016 FINDINGS: Heart and mediastinal contours are within normal limits. No focal opacities or effusions. No acute bony abnormality. IMPRESSION: No active cardiopulmonary disease. Electronically Signed   By: Charlett NoseKevin  Dover M.D.   On: 06/10/2017 09:24    Procedures Procedures (including critical care time)  Medications Ordered in ED Medications  aspirin chewable tablet 324 mg (324 mg Oral Given 06/10/17 1006)  ketorolac (TORADOL) 15 MG/ML injection 15 mg (15 mg Intravenous Given 06/10/17 1005)     Initial Impression / Assessment and Plan / ED Course  I have reviewed the triage vital signs and the nursing notes.  Pertinent labs & imaging results that were available during my care of the patient were reviewed by me and considered in my medical decision making (see chart for details).     36 year old female with history of hypertension presents with concern of chest pain. Differential diagnosis for chest pain includes pulmonary embolus, dissection, pneumothorax, pneumonia, ACS, myocarditis, pericarditis.  EKG was done and evaluate by me and showed no acute ST changes and no signs of pericarditis. Chest x-ray was done and evaluated by me and radiology and showed no sign of pneumonia or pneumothorax. No mediastinal widening, normal bilateral pulses, doubt dissection. Patient is PERC negative and low risk Wells and have low suspicion for PE.  Patient is low risk HEART score and had delta troponins which were both negative. Given this evaluation, history and physical  have low suspicion for pulmonary embolus, pneumonia, ACS, myocarditis, pericarditis, dissection.   Given toradol with some improvement.  Patient reports having prior referral to Cardiology for stress testing and feel given this prior discussion with her PCP, CP, part of family hx unknown, this is reasonable.  Given number for Cardiology, recommend follow up with PCP.     Final Clinical Impressions(s) / ED Diagnoses   Final diagnoses:  Chest pain, unspecified type    ED Discharge Orders    None       Alvira MondaySchlossman, Shakenya Stoneberg, MD 06/10/17 [id number]    Alvira MondaySchlossman, Kalia Vahey, MD 06/24/17 1245

## 2017-06-10 NOTE — ED Triage Notes (Signed)
Pt reports left side chest pain that started last night and "just doesn't feel right in her head." pt describes pain as a pressure to left clavicle area. ekg done and no acute distress is noted at this time.

## 2018-04-13 ENCOUNTER — Encounter: Payer: Self-pay | Admitting: Obstetrics & Gynecology

## 2018-04-13 ENCOUNTER — Ambulatory Visit (INDEPENDENT_AMBULATORY_CARE_PROVIDER_SITE_OTHER): Admitting: Obstetrics & Gynecology

## 2018-04-13 VITALS — BP 113/83 | HR 79 | Ht 64.0 in | Wt 149.0 lb

## 2018-04-13 DIAGNOSIS — Z124 Encounter for screening for malignant neoplasm of cervix: Secondary | ICD-10-CM | POA: Diagnosis not present

## 2018-04-13 DIAGNOSIS — Z1151 Encounter for screening for human papillomavirus (HPV): Secondary | ICD-10-CM

## 2018-04-13 DIAGNOSIS — Z01419 Encounter for gynecological examination (general) (routine) without abnormal findings: Secondary | ICD-10-CM | POA: Diagnosis not present

## 2018-04-13 NOTE — Progress Notes (Signed)
Last pap 04/20/16- normal

## 2018-04-13 NOTE — Progress Notes (Signed)
Subjective:    Christina Stone is a 37 y.o. 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 The patient has no complaints today. The patient is sexually active. GYN screening history: last pap: was normal. The patient wears seatbelts: yes. The patient participates in regular exercise: yes. Has the patient ever been transfused or tattooed?: yes. The patient reports that there is not domestic violence in her life.   Menstrual History: OB History    Gravida  2   Para  2   Term  2   Preterm      AB      Living  2     SAB      TAB      Ectopic      Multiple      Live Births              Menarche age: 7 Patient's last menstrual period was 04/04/2018.    The following portions of the patient's history were reviewed and updated as appropriate: allergies, current medications, past family history, past medical history, past social history, past surgical history and problem list.  Review of Systems Pertinent items are noted in HPI.   Periods are monthly, 5 days Uses pull and pray for contraception FH- + breast cancer in mom, maternal GM, maternal aunt, and maternal great GM + ovarian cancer in maternal GM Married for 11 years Works for M[employer] at [employer]  Had her flu vaccine this season   Objective:    BP 113/83   Pulse 79   Ht 5\' 4"  (1.626 m)   Wt 149 lb (67.6 kg)   LMP 04/04/2018   BMI 25.58 kg/m   General Appearance:    Alert, cooperative, no distress, appears stated age  Head:    Normocephalic, without obvious abnormality, atraumatic  Eyes:    PERRL, conjunctiva/corneas clear, EOM's intact, fundi    benign, both eyes  Ears:    Normal TM's and external ear canals, both ears  Nose:   Nares normal, septum midline, mucosa normal, no drainage    or sinus tenderness  Throat:   Lips, mucosa, and tongue normal; teeth and gums normal  Neck:   Supple, symmetrical, trachea midline, no adenopathy;    thyroid:  no  enlargement/tenderness/nodules; no carotid   bruit or JVD  Back:     Symmetric, no curvature, ROM normal, no CVA tenderness  Lungs:     Clear to auscultation bilaterally, respirations unlabored  Chest Wall:    No tenderness or deformity   Heart:    Regular rate and rhythm, S1 and S2 normal, no murmur, rub   or gallop  Breast Exam:    No tenderness, masses, or nipple abnormality  Abdomen:     Soft, non-tender, bowel sounds active all four quadrants,    no masses, no organomegaly  Genitalia:    Normal female without lesion, discharge or tenderness     Extremities:   Extremities normal, atraumatic, no cyanosis or edema  Pulses:   2+ and symmetric all extremities  Skin:   Skin color, texture, turgor normal, no rashes or lesions  Lymph nodes:   Cervical, supraclavicular, and axillary nodes normal  Neurologic:   CNII-XII intact, normal strength, sensation and reflexes    throughout  .    Assessment:    Healthy female exam.    Plan:     Thin prep Pap smear. with  cotesting  Invitae today Fasting labs today Rec laparoscopic bilateral salpingectomy for contraception and reduction of ovarian cancer risk. If + genetics, rec BSO

## 2018-04-14 LAB — CBC
HCT: 41.4 % (ref 35.0–45.0)
Hemoglobin: 14 g/dL (ref 11.7–15.5)
MCH: 31.5 pg (ref 27.0–33.0)
MCHC: 33.8 g/dL (ref 32.0–36.0)
MCV: 93.2 fL (ref 80.0–100.0)
MPV: 11.1 fL (ref 7.5–12.5)
Platelets: 303 10*3/uL (ref 140–400)
RBC: 4.44 10*6/uL (ref 3.80–5.10)
RDW: 12 % (ref 11.0–15.0)
WBC: 8.3 10*3/uL (ref 3.8–10.8)

## 2018-04-14 LAB — LIPID PANEL
CHOLESTEROL: 281 mg/dL — AB (ref <200)
HDL: 57 mg/dL (ref >50)
LDL Cholesterol (Calc): 201 mg/dL (calc) — ABNORMAL HIGH
Non-HDL Cholesterol (Calc): 224 mg/dL (calc) — ABNORMAL HIGH (ref <130)
Total CHOL/HDL Ratio: 4.9 (calc) (ref <5.0)
Triglycerides: 102 mg/dL (ref <150)

## 2018-04-14 LAB — COMPREHENSIVE METABOLIC PANEL
AG RATIO: 2 (calc) (ref 1.0–2.5)
ALBUMIN MSPROF: 4.7 g/dL (ref 3.6–5.1)
ALT: 16 U/L (ref 6–29)
AST: 17 U/L (ref 10–30)
Alkaline phosphatase (APISO): 45 U/L (ref 33–115)
BILIRUBIN TOTAL: 0.5 mg/dL (ref 0.2–1.2)
BUN: 11 mg/dL (ref 7–25)
CALCIUM: 10 mg/dL (ref 8.6–10.2)
CHLORIDE: 101 mmol/L (ref 98–110)
CO2: 22 mmol/L (ref 20–32)
Creat: 0.74 mg/dL (ref 0.50–1.10)
GLOBULIN: 2.4 g/dL (ref 1.9–3.7)
Glucose, Bld: 68 mg/dL (ref 65–99)
POTASSIUM: 4.5 mmol/L (ref 3.5–5.3)
Sodium: 140 mmol/L (ref 135–146)
Total Protein: 7.1 g/dL (ref 6.1–8.1)

## 2018-04-14 LAB — TSH: TSH: 1.69 m[IU]/L

## 2018-04-14 LAB — VITAMIN D 25 HYDROXY (VIT D DEFICIENCY, FRACTURES): Vit D, 25-Hydroxy: 30 ng/mL (ref 30–100)

## 2018-04-17 LAB — CYTOLOGY - PAP
Diagnosis: NEGATIVE
HPV (WINDOPATH): NOT DETECTED

## 2018-04-21 ENCOUNTER — Encounter: Payer: Self-pay | Admitting: *Deleted

## 2019-04-24 DIAGNOSIS — F329 Major depressive disorder, single episode, unspecified: Secondary | ICD-10-CM | POA: Insufficient documentation

## 2020-08-08 DIAGNOSIS — E785 Hyperlipidemia, unspecified: Secondary | ICD-10-CM | POA: Insufficient documentation

## 2020-10-09 ENCOUNTER — Emergency Department (HOSPITAL_BASED_OUTPATIENT_CLINIC_OR_DEPARTMENT_OTHER)

## 2020-10-09 ENCOUNTER — Encounter (HOSPITAL_BASED_OUTPATIENT_CLINIC_OR_DEPARTMENT_OTHER): Payer: Self-pay

## 2020-10-09 ENCOUNTER — Other Ambulatory Visit: Payer: Self-pay

## 2020-10-09 ENCOUNTER — Emergency Department (HOSPITAL_BASED_OUTPATIENT_CLINIC_OR_DEPARTMENT_OTHER)
Admission: EM | Admit: 2020-10-09 | Discharge: 2020-10-09 | Disposition: A | Discharge status: Home / Self Care | Attending: Emergency Medicine | Admitting: Emergency Medicine

## 2020-10-09 DIAGNOSIS — I10 Essential (primary) hypertension: Secondary | ICD-10-CM | POA: Diagnosis not present

## 2020-10-09 DIAGNOSIS — N12 Tubulo-interstitial nephritis, not specified as acute or chronic: Secondary | ICD-10-CM | POA: Insufficient documentation

## 2020-10-09 DIAGNOSIS — Z87891 Personal history of nicotine dependence: Secondary | ICD-10-CM | POA: Insufficient documentation

## 2020-10-09 DIAGNOSIS — R1031 Right lower quadrant pain: Secondary | ICD-10-CM | POA: Diagnosis present

## 2020-10-09 HISTORY — DX: Calculus of kidney: N20.0

## 2020-10-09 LAB — CBC WITH DIFFERENTIAL/PLATELET
Abs Immature Granulocytes: 0.12 10*3/uL — ABNORMAL HIGH (ref 0.00–0.07)
Basophils Absolute: 0 10*3/uL (ref 0.0–0.1)
Basophils Relative: 0 %
Eosinophils Absolute: 0 10*3/uL (ref 0.0–0.5)
Eosinophils Relative: 0 %
HCT: 37.3 % (ref 36.0–46.0)
Hemoglobin: 11.9 g/dL — ABNORMAL LOW (ref 12.0–15.0)
Immature Granulocytes: 1 %
Lymphocytes Relative: 4 %
Lymphs Abs: 0.7 10*3/uL (ref 0.7–4.0)
MCH: 28.3 pg (ref 26.0–34.0)
MCHC: 31.9 g/dL (ref 30.0–36.0)
MCV: 88.6 fL (ref 80.0–100.0)
Monocytes Absolute: 1.6 10*3/uL — ABNORMAL HIGH (ref 0.1–1.0)
Monocytes Relative: 10 %
Neutro Abs: 14.3 10*3/uL — ABNORMAL HIGH (ref 1.7–7.7)
Neutrophils Relative %: 85 %
Platelets: 301 10*3/uL (ref 150–400)
RBC: 4.21 MIL/uL (ref 3.87–5.11)
RDW: 12.8 % (ref 11.5–15.5)
WBC: 16.8 10*3/uL — ABNORMAL HIGH (ref 4.0–10.5)
nRBC: 0 % (ref 0.0–0.2)

## 2020-10-09 LAB — COMPREHENSIVE METABOLIC PANEL
ALT: 44 U/L (ref 0–44)
AST: 18 U/L (ref 15–41)
Albumin: 4.3 g/dL (ref 3.5–5.0)
Alkaline Phosphatase: 58 U/L (ref 38–126)
Anion gap: 14 (ref 5–15)
BUN: 16 mg/dL (ref 6–20)
CO2: 20 mmol/L — ABNORMAL LOW (ref 22–32)
Calcium: 9.2 mg/dL (ref 8.9–10.3)
Chloride: 103 mmol/L (ref 98–111)
Creatinine, Ser: 0.94 mg/dL (ref 0.44–1.00)
GFR, Estimated: >60 mL/min (ref >60)
Glucose, Bld: 109 mg/dL — ABNORMAL HIGH (ref 70–99)
Potassium: 3.7 mmol/L (ref 3.5–5.1)
Sodium: 137 mmol/L (ref 135–145)
Total Bilirubin: 0.4 mg/dL (ref 0.3–1.2)
Total Protein: 7.4 g/dL (ref 6.5–8.1)

## 2020-10-09 LAB — URINALYSIS, ROUTINE W REFLEX MICROSCOPIC
Bilirubin Urine: NEGATIVE
Glucose, UA: NEGATIVE mg/dL
Ketones, ur: NEGATIVE mg/dL
Nitrite: POSITIVE — AB
Protein, ur: 30 mg/dL — AB
Specific Gravity, Urine: 1.021 (ref 1.005–1.030)
WBC, UA: >50 WBC/hpf — ABNORMAL HIGH (ref 0–5)
pH: 6.5 (ref 5.0–8.0)

## 2020-10-09 LAB — PREGNANCY, URINE: Preg Test, Ur: NEGATIVE

## 2020-10-09 MED ORDER — HYDROMORPHONE HCL 2 MG PO TABS: 2.0000 mg | ORAL_TABLET | ORAL | 0 refills | Status: DC | PRN

## 2020-10-09 MED ORDER — ONDANSETRON HCL 4 MG/2ML IJ SOLN
4.0000 mg | Freq: Once | INTRAMUSCULAR | Status: AC | PRN
  Administered 2020-10-09: 4 mg via INTRAVENOUS
  Filled 2020-10-09: qty 2

## 2020-10-09 MED ORDER — KETOROLAC TROMETHAMINE 15 MG/ML IJ SOLN
15.0000 mg | Freq: Once | INTRAMUSCULAR | Status: AC
  Administered 2020-10-09: 15 mg via INTRAVENOUS
  Filled 2020-10-09: qty 1

## 2020-10-09 MED ORDER — SODIUM CHLORIDE 0.9 % IV BOLUS
1000.0000 mL | Freq: Once | INTRAVENOUS | Status: AC
  Administered 2020-10-09: 1000 mL via INTRAVENOUS

## 2020-10-09 MED ORDER — ONDANSETRON 4 MG PO TBDP: 4.0000 mg | ORAL_TABLET | Freq: Three times a day (TID) | ORAL | 0 refills | Status: DC | PRN

## 2020-10-09 MED ORDER — HYDROMORPHONE HCL 1 MG/ML IJ SOLN
1.0000 mg | Freq: Once | INTRAMUSCULAR | Status: AC
  Administered 2020-10-09: 1 mg via INTRAVENOUS
  Filled 2020-10-09: qty 1

## 2020-10-09 MED ORDER — SODIUM CHLORIDE 0.9 % IV SOLN
1.0000 g | Freq: Once | INTRAVENOUS | Status: AC
  Administered 2020-10-09: 1 g via INTRAVENOUS
  Filled 2020-10-09: qty 10

## 2020-10-09 NOTE — ED Triage Notes (Signed)
Pt reports having sudden onset of right right flank pain started around 100pm today. Pt reports n/v. Pt reports hx of kidney stone.

## 2020-10-09 NOTE — Discharge Instructions (Addendum)
If you develop worsening, continued, or recurrent abdominal pain, uncontrolled vomiting, fever, chest or back pain, or any other new/concerning symptoms then return to the ER for evaluation.  

## 2020-10-09 NOTE — ED Provider Notes (Signed)
[facility] EMERGENCY DEPT Provider Note   CSN: [id number] Arrival date & time: 10/09/20  1555     History Chief Complaint  Patient presents with  . Flank Pain    Christina Stone is a 40 y.o. female.  HPI 40 year old female presents with sudden onset right flank pain.  Started at around 1 PM.  Was primarily in her back but now is more in her right lower quadrant.  She is had multiple episodes of emesis.  She had some dysuria starting a few days ago and got an ED visit and has been on Bactrim for a day and a half and the dysuria is now gone.  Still taking Azo for it as well.  This pain is about a 9 or 10 out of 10 and feels worse than any kidney stone she has had recently.   Past Medical History:  Diagnosis Date  . Abnormal Pap smear of cervix   . Anemia   . Bulging lumbar disc   . Chronic headaches   . Hypertension   . Kidney calculi   . Kidney stones     Patient Active Problem List   Diagnosis Date Noted  . Family history of breast cancer in female 02/25/2015  . Abdominal pain 01/25/2014  . Nausea alone 01/25/2014    Past Surgical History:  Procedure Laterality Date  . Stents in kidneys    . WISDOM TOOTH EXTRACTION       OB History    Gravida  2   Para  2   Term  2   Preterm      AB      Living  2     SAB      IAB      Ectopic      Multiple      Live Births              Family History  Problem Relation Age of Onset  . Breast cancer Maternal Grandmother        great grand  . Breast cancer Maternal Aunt     Social History   Tobacco Use  . Smoking status: Former Smoker    Packs/day: 0.90    Years: 15.00    Pack years: 13.50    Types: Cigarettes  . Smokeless tobacco: Never Used  Substance Use Topics  . Alcohol use: No    Alcohol/week: 0.0 standard drinks    Comment: soc  . Drug use: No    Home Medications Prior to Admission medications   Medication Sig Start Date End Date Taking? Authorizing Provider   famotidine (PEPCID) 20 MG tablet TAKE 1 TABLET(20 MG) BY MOUTH TWICE DAILY 08/07/20  Yes [provider]  HYDROmorphone (DILAUDID) 2 MG tablet Take 1 tablet (2 mg total) by mouth every 4 (four) hours as needed for severe pain. 10/09/20  Yes Pricilla Loveless, MD  ondansetron (ZOFRAN ODT) 4 MG disintegrating tablet Take 1 tablet (4 mg total) by mouth every 8 (eight) hours as needed for nausea or vomiting. 10/09/20  Yes Pricilla Loveless, MD  Cholecalciferol 25 MCG (1000 UT) tablet Take by mouth.    [provider]  gabapentin (NEURONTIN) 600 MG tablet Take 600 mg by mouth 2 (two) times daily.  04/25/15   [provider]  methocarbamol (ROBAXIN) 750 MG tablet Take 750 mg by mouth 3 (three) times daily. 08/07/20   [provider]    Allergies    Biaxin [clarithromycin], Penicillins, and Tramadol  Review of Systems   Review of Systems  Constitutional: Negative for fever.  Gastrointestinal: Positive for abdominal pain, nausea and vomiting.  Genitourinary: Positive for flank pain. Negative for dysuria.  All other systems reviewed and are negative.   Physical Exam Updated Vital Signs BP 107/73 (BP Location: Right Arm)   Pulse 61   Temp 98.2 F (36.8 C) (Oral)   Resp 20   Ht 5\' 4"  (1.626 m)   Wt 56.7 kg   LMP 09/29/2020 Comment: neg preg test  SpO2 96%   BMI 21.46 kg/m   Physical Exam Vitals and nursing note reviewed.  Constitutional:      General: She is in acute distress (in pain).     Appearance: She is well-developed.  HENT:     Head: Normocephalic and atraumatic.     Right Ear: External ear normal.     Left Ear: External ear normal.     Nose: Nose normal.  Eyes:     General:        Right eye: No discharge.        Left eye: No discharge.  Cardiovascular:     Rate and Rhythm: Normal rate and regular rhythm.     Heart sounds: Normal heart sounds.  Pulmonary:     Effort: Pulmonary effort is normal.     Breath sounds: Normal breath sounds.   Abdominal:     Palpations: Abdomen is soft.     Tenderness: There is no abdominal tenderness. There is right CVA tenderness.  Skin:    General: Skin is warm and dry.  Neurological:     Mental Status: She is alert.  Psychiatric:        Mood and Affect: Mood is not anxious.     ED Results / Procedures / Treatments   Labs (all labs ordered are listed, but only abnormal results are displayed) Labs Reviewed  URINALYSIS, ROUTINE W REFLEX MICROSCOPIC - Abnormal; Notable for the following components:      Result Value   APPearance HAZY (*)    Hgb urine dipstick TRACE (*)    Protein, ur 30 (*)    Nitrite POSITIVE (*)    Leukocytes,Ua MODERATE (*)    WBC, UA >50 (*)    Bacteria, UA RARE (*)    Non Squamous Epithelial 0-5 (*)    All other components within normal limits  COMPREHENSIVE METABOLIC PANEL - Abnormal; Notable for the following components:   CO2 20 (*)    Glucose, Bld 109 (*)    All other components within normal limits  CBC WITH DIFFERENTIAL/PLATELET - Abnormal; Notable for the following components:   WBC 16.8 (*)    Hemoglobin 11.9 (*)    Neutro Abs 14.3 (*)    Monocytes Absolute 1.6 (*)    Abs Immature Granulocytes 0.12 (*)    All other components within normal limits  URINE CULTURE  PREGNANCY, URINE    EKG None  Radiology CT Renal Stone Study  Result Date: 10/09/2020 CLINICAL DATA:  Sudden onset of right flank pain. EXAM: CT ABDOMEN AND PELVIS WITHOUT CONTRAST TECHNIQUE: Multidetector CT imaging of the abdomen and pelvis was performed following the standard protocol without IV contrast. COMPARISON:  May 16, 2014 FINDINGS: Lower chest: No acute abnormality. Hepatobiliary: No acute liver abnormality is seen. 9 mm subcapsular right hepatic cyst. No gallstones, gallbladder wall thickening, or biliary dilatation. Pancreas: Unremarkable. No pancreatic ductal dilatation or surrounding inflammatory changes. Spleen: Normal in size without focal abnormality.  Adrenals/Urinary Tract:  Normal adrenal glands. No evidence of hydronephrosis or nephrolithiasis. 1.3 cm fluid density circumscribed structure in the posterior mid polar region of the right kidney, which may represent a cyst. Normal ureters. Normal urinary bladder. Stomach/Bowel: Stomach is within normal limits. Appendix appears normal. No evidence of bowel wall thickening, distention, or inflammatory changes. Vascular/Lymphatic: No significant vascular findings are present. No enlarged abdominal or pelvic lymph nodes. Reproductive: Uterus and bilateral adnexa are unremarkable. Other: No abdominal wall hernia or abnormality. No abdominopelvic ascites. Musculoskeletal: L5-S1 spondylosis. IMPRESSION: 1. No evidence of nephrolithiasis or obstructive uropathy. 2. 1.3 cm fluid density circumscribed structure in the posterior mid polar region of the right kidney, which may represent a cyst. 3. L5-S1 spondylosis. Electronically Signed   By: Ted Mcalpine M.D.   On: 10/09/2020 18:26    Procedures Procedures   Medications Ordered in ED Medications  sodium chloride 0.9 % bolus 1,000 mL (0 mLs Intravenous Stopped 10/09/20 1831)  ondansetron (ZOFRAN) injection 4 mg (4 mg Intravenous Given 10/09/20 1616)  HYDROmorphone (DILAUDID) injection 1 mg (1 mg Intravenous Given 10/09/20 1616)  HYDROmorphone (DILAUDID) injection 1 mg (1 mg Intravenous Given 10/09/20 1827)  ketorolac (TORADOL) 15 MG/ML injection 15 mg (15 mg Intravenous Given 10/09/20 1927)  cefTRIAXone (ROCEPHIN) 1 g in sodium chloride 0.9 % 100 mL IVPB (0 g Intravenous Stopped 10/09/20 2018)    ED Course  I have reviewed the triage vital signs and the nursing notes.  Pertinent labs & imaging results that were available during my care of the patient were reviewed by me and considered in my medical decision making (see chart for details).    MDM Rules/Calculators/A&P                          Patient CT has been reviewed and shows no hydronephrosis or  obvious kidney stone.  Labs do show a leukocytosis and an commendation with her dysuria, I am concerned she has an acute pyelonephritis.  However should her pain is better, vital signs are normal and no vomiting.  I think she is amenable to outpatient treatment.  She has only had 3 doses of the Bactrim so is hard to tell if this is truly a treatment failure.  I discussed options with patient and she want to continue Bactrim for now and return if symptoms worsen.  Will give short course of pain and nausea control. Final Clinical Impression(s) / ED Diagnoses Final diagnoses:  Pyelonephritis    Rx / DC Orders ED Discharge Orders         Ordered    HYDROmorphone (DILAUDID) 2 MG tablet  Every 4 hours PRN        10/09/20 2004    ondansetron (ZOFRAN ODT) 4 MG disintegrating tablet  Every 8 hours PRN        10/09/20 2004           Pricilla Loveless, MD 10/09/20 2130

## 2020-10-11 LAB — URINE CULTURE: Culture: <10000 — AB

## 2020-12-30 LAB — HM MAMMOGRAPHY: HM Mammogram: NORMAL (ref 0–4)

## 2021-01-20 ENCOUNTER — Other Ambulatory Visit (HOSPITAL_BASED_OUTPATIENT_CLINIC_OR_DEPARTMENT_OTHER): Payer: Self-pay

## 2021-02-07 ENCOUNTER — Emergency Department (HOSPITAL_BASED_OUTPATIENT_CLINIC_OR_DEPARTMENT_OTHER): Admission: EM | Admit: 2021-02-07 | Discharge: 2021-02-07 | Discharge status: Against Medical Advice

## 2021-02-07 NOTE — ED Notes (Signed)
CALLED FOR CLIENT TO TRIAGE, NO ANSWER

## 2021-02-07 NOTE — ED Notes (Signed)
CALLED FOR TRIAGE X 2 NO ANSWER

## 2021-02-23 DIAGNOSIS — K760 Fatty (change of) liver, not elsewhere classified: Secondary | ICD-10-CM | POA: Insufficient documentation

## 2021-03-23 ENCOUNTER — Ambulatory Visit (INDEPENDENT_AMBULATORY_CARE_PROVIDER_SITE_OTHER): Admitting: Obstetrics and Gynecology

## 2021-03-23 ENCOUNTER — Other Ambulatory Visit (HOSPITAL_COMMUNITY)
Admission: RE | Admit: 2021-03-23 | Discharge: 2021-03-23 | Disposition: A | Discharge status: Home / Self Care | Source: Ambulatory Visit | Attending: Obstetrics and Gynecology | Admitting: Obstetrics and Gynecology

## 2021-03-23 ENCOUNTER — Encounter: Payer: Self-pay | Admitting: Obstetrics and Gynecology

## 2021-03-23 ENCOUNTER — Other Ambulatory Visit: Payer: Self-pay

## 2021-03-23 VITALS — BP 112/64 | HR 71 | Ht 64.0 in | Wt 119.0 lb

## 2021-03-23 DIAGNOSIS — Z01419 Encounter for gynecological examination (general) (routine) without abnormal findings: Secondary | ICD-10-CM

## 2021-03-23 NOTE — Progress Notes (Signed)
GYNECOLOGY ANNUAL PREVENTATIVE CARE ENCOUNTER NOTE  History:     Christina Stone is a 40 y.o. G3P2002 female here for a routine annual gynecologic exam.  Current complaints: None.   Denies abnormal vaginal bleeding, discharge, pelvic pain, problems with intercourse or other gynecologic concerns.   Has significant [place] of breast cancer. BRCA 1/2 negative 04/2018.     Gynecologic History No LMP recorded. Contraception: coitus interruptus Last Pap: 04/2018. Result was normal with negative HPV Last Mammogram: 2022.  Result was normal per pt - she had it done as soon as she turned 40.    Obstetric History OB History  Gravida Para Term Preterm AB Living  2 2 2     2   SAB IAB Ectopic Multiple Live Births               # Outcome Date GA Lbr Len/2nd Weight Sex Delivery Anes PTL Lv  2 Term      Vag-Spont     1 Term      Vag-Spont       Past Medical History:  Diagnosis Date   Abnormal Pap smear of cervix    Anemia    Bulging lumbar disc    Chronic headaches    Hypertension    Kidney calculi    Kidney stones     Past Surgical History:  Procedure Laterality Date   Stents in kidneys     WISDOM TOOTH EXTRACTION      Current Outpatient Medications on File Prior to Visit  Medication Sig Dispense Refill   famotidine (PEPCID) 20 MG tablet TAKE 1 TABLET(20 MG) BY MOUTH TWICE DAILY     gabapentin (NEURONTIN) 600 MG tablet Take 600 mg by mouth 2 (two) times daily.   3   methocarbamol (ROBAXIN) 750 MG tablet Take 750 mg by mouth 3 (three) times daily.     Cholecalciferol 25 MCG (1000 UT) tablet Take by mouth. (Patient not taking: Reported on 03/23/2021)     HYDROmorphone (DILAUDID) 2 MG tablet Take 1 tablet (2 mg total) by mouth every 4 (four) hours as needed for severe pain. (Patient not taking: Reported on 03/23/2021) 10 tablet 0   ondansetron (ZOFRAN ODT) 4 MG disintegrating tablet Take 1 tablet (4 mg total) by mouth every 8 (eight) hours as needed for nausea or vomiting. (Patient  not taking: Reported on 03/23/2021) 10 tablet 0   No current facility-administered medications on file prior to visit.    Allergies  Allergen Reactions   Biaxin [Clarithromycin] Hives   Penicillins Anaphylaxis    Has patient had a PCN reaction causing immediate rash, facial/tongue/throat swelling, SOB or lightheadedness with hypotension: Has patient had a PCN reaction causing severe rash involving mucus membranes or skin necrosis:  Has patient had a PCN reaction that required hospitalization Did PCN reaction occurring within the last 10 years: childhood allergy If all of the above answers are "NO", then may proceed with Cephalosporin use. per pt "i dont remember i was really young, but it puts me into shock   Tramadol Other (See Comments)    Causes dizziness    Social History:  reports that she has quit smoking. Her smoking use included cigarettes. She has a 13.50 pack-year smoking history. She has never used smokeless tobacco. She reports that she does not drink alcohol and does not use drugs.  Family History  Problem Relation Age of Onset   Breast cancer Maternal Grandmother        great grand  Breast cancer Maternal Aunt     The following portions of the patient's history were reviewed and updated as appropriate: allergies, current medications, past family history, past medical history, past social history, past surgical history and problem list.  Review of Systems Pertinent items noted in HPI and remainder of comprehensive ROS otherwise negative.  Physical Exam:  BP 112/64   Pulse 71   Ht 5' 4"  (1.626 m)   Wt 119 lb (54 kg)   BMI 20.43 kg/m  CONSTITUTIONAL: Well-developed, well-nourished female in no acute distress.  HENT:  Normocephalic, atraumatic, External right and left ear normal.  EYES: Conjunctivae and EOM are normal. Pupils are equal, round, and reactive to light. No scleral icterus.  NECK: Normal range of motion, supple, no masses.  Normal thyroid.  SKIN: Skin  is warm and dry. No rash noted. Not diaphoretic. No erythema. No pallor. MUSCULOSKELETAL: Normal range of motion. No tenderness.  No cyanosis, clubbing, or edema. NEUROLOGIC: Alert and oriented to person, place, and time. Normal reflexes, muscle tone coordination.  PSYCHIATRIC: Normal mood and affect. Normal behavior. Normal judgment and thought content.  CARDIOVASCULAR: Normal heart rate noted, regular rhythm RESPIRATORY: Clear to auscultation bilaterally. Effort and breath sounds normal, no problems with respiration noted.  BREASTS: Symmetric in size. No masses, tenderness, skin changes, nipple drainage, or lymphadenopathy bilaterally. Performed in the presence of a chaperone. ABDOMEN: Soft, no distention noted.  No tenderness, rebound or guarding.  PELVIC: External genitalia normal, Vagina normal without discharge, Urethra without abnormality or discharge, no bladder tenderness, cervix normal in appearance, no CMT, uterus normal size, shape, and consistency, no adnexal masses or tenderness. Performed in the presence of a chaperone.    Assessment and Plan:    1. Encounter for annual routine gynecological examination - Cervical cancer screening: Discussed guidelines. Pap with HPV done  - Birth Control: Discussed options and their risks, benefits and common side effects; discussed VTE with estrogen containing options. Desires:  see below - Breast Health: Encouraged self breast awareness/SBE. Teaching provided. Discussed limits of clinical breast exam for detecting breast cancer. Up to date on MXR - F/U 12 months and prn  NFP for birth control. Plan is for vasectomy. Discussed pros/cons of salpingectomy in s/o BRCA negative and [facility] for breast cancer. Would not recommend for this specific purpose, but if spouse isn't going for vasectomy, then would certainly be an excellent option.    Routine preventative health maintenance measures emphasized. Please refer to After Visit Summary for other  counseling recommendations.      Radene Gunning, MD, [place] [facility], [place]

## 2021-03-25 LAB — CYTOLOGY - PAP
Comment: NEGATIVE
High risk HPV: NEGATIVE

## 2021-03-26 ENCOUNTER — Encounter: Payer: Self-pay | Admitting: Obstetrics and Gynecology

## 2021-03-26 DIAGNOSIS — R87619 Unspecified abnormal cytological findings in specimens from cervix uteri: Secondary | ICD-10-CM | POA: Insufficient documentation

## 2021-12-03 ENCOUNTER — Emergency Department (HOSPITAL_BASED_OUTPATIENT_CLINIC_OR_DEPARTMENT_OTHER)

## 2021-12-03 ENCOUNTER — Emergency Department (HOSPITAL_BASED_OUTPATIENT_CLINIC_OR_DEPARTMENT_OTHER)
Admission: EM | Admit: 2021-12-03 | Discharge: 2021-12-03 | Disposition: A | Discharge status: Home / Self Care | Attending: Emergency Medicine | Admitting: Emergency Medicine

## 2021-12-03 ENCOUNTER — Encounter (HOSPITAL_BASED_OUTPATIENT_CLINIC_OR_DEPARTMENT_OTHER): Payer: Self-pay

## 2021-12-03 ENCOUNTER — Other Ambulatory Visit: Payer: Self-pay

## 2021-12-03 DIAGNOSIS — R112 Nausea with vomiting, unspecified: Secondary | ICD-10-CM | POA: Diagnosis present

## 2021-12-03 DIAGNOSIS — I10 Essential (primary) hypertension: Secondary | ICD-10-CM | POA: Insufficient documentation

## 2021-12-03 DIAGNOSIS — Z87442 Personal history of urinary calculi: Secondary | ICD-10-CM | POA: Diagnosis not present

## 2021-12-03 DIAGNOSIS — R7401 Elevation of levels of liver transaminase levels: Secondary | ICD-10-CM | POA: Insufficient documentation

## 2021-12-03 DIAGNOSIS — R197 Diarrhea, unspecified: Secondary | ICD-10-CM | POA: Diagnosis not present

## 2021-12-03 LAB — URINALYSIS, ROUTINE W REFLEX MICROSCOPIC
Bilirubin Urine: NEGATIVE
Glucose, UA: NEGATIVE mg/dL
Ketones, ur: 40 mg/dL — AB
Leukocytes,Ua: NEGATIVE
Nitrite: NEGATIVE
Protein, ur: 30 mg/dL — AB
Specific Gravity, Urine: 1.038 — ABNORMAL HIGH (ref 1.005–1.030)
pH: 6.5 (ref 5.0–8.0)

## 2021-12-03 LAB — COMPREHENSIVE METABOLIC PANEL
ALT: 522 U/L — ABNORMAL HIGH (ref 0–44)
AST: 275 U/L — ABNORMAL HIGH (ref 15–41)
Albumin: 4.7 g/dL (ref 3.5–5.0)
Alkaline Phosphatase: 54 U/L (ref 38–126)
Anion gap: 13 (ref 5–15)
BUN: 13 mg/dL (ref 6–20)
CO2: 24 mmol/L (ref 22–32)
Calcium: 9.4 mg/dL (ref 8.9–10.3)
Chloride: 104 mmol/L (ref 98–111)
Creatinine, Ser: 0.8 mg/dL (ref 0.44–1.00)
GFR, Estimated: >60 mL/min (ref >60)
Glucose, Bld: 106 mg/dL — ABNORMAL HIGH (ref 70–99)
Potassium: 3.9 mmol/L (ref 3.5–5.1)
Sodium: 141 mmol/L (ref 135–145)
Total Bilirubin: 0.7 mg/dL (ref 0.3–1.2)
Total Protein: 7.4 g/dL (ref 6.5–8.1)

## 2021-12-03 LAB — HEPATITIS PANEL, ACUTE
HCV Ab: NONREACTIVE
Hep A IgM: NONREACTIVE
Hep B C IgM: NONREACTIVE
Hepatitis B Surface Ag: NONREACTIVE

## 2021-12-03 LAB — CBC
HCT: 36.2 % (ref 36.0–46.0)
Hemoglobin: 11.7 g/dL — ABNORMAL LOW (ref 12.0–15.0)
MCH: 27.9 pg (ref 26.0–34.0)
MCHC: 32.3 g/dL (ref 30.0–36.0)
MCV: 86.2 fL (ref 80.0–100.0)
Platelets: 413 10*3/uL — ABNORMAL HIGH (ref 150–400)
RBC: 4.2 MIL/uL (ref 3.87–5.11)
RDW: 14.6 % (ref 11.5–15.5)
WBC: 8 10*3/uL (ref 4.0–10.5)
nRBC: 0 % (ref 0.0–0.2)

## 2021-12-03 LAB — LIPASE, BLOOD: Lipase: 12 U/L (ref 11–51)

## 2021-12-03 LAB — PREGNANCY, URINE: Preg Test, Ur: NEGATIVE

## 2021-12-03 MED ORDER — IOHEXOL 300 MG/ML  SOLN
100.0000 mL | Freq: Once | INTRAMUSCULAR | Status: AC | PRN
  Administered 2021-12-03: 60 mL via INTRAVENOUS

## 2021-12-03 MED ORDER — ONDANSETRON HCL 4 MG PO TABS: 4.0000 mg | ORAL_TABLET | Freq: Four times a day (QID) | ORAL | 0 refills | Status: DC

## 2021-12-03 MED ORDER — MORPHINE SULFATE (PF) 4 MG/ML IV SOLN
4.0000 mg | Freq: Once | INTRAVENOUS | Status: AC
  Administered 2021-12-03: 4 mg via INTRAVENOUS
  Filled 2021-12-03: qty 1

## 2021-12-03 MED ORDER — ALUM & MAG HYDROXIDE-SIMETH 200-200-20 MG/5ML PO SUSP: 30.0000 mL | Freq: Once | ORAL | Status: DC

## 2021-12-03 MED ORDER — ONDANSETRON 4 MG PO TBDP
4.0000 mg | ORAL_TABLET | Freq: Once | ORAL | Status: AC | PRN
  Administered 2021-12-03: 4 mg via ORAL
  Filled 2021-12-03: qty 1

## 2021-12-03 MED ORDER — LIDOCAINE VISCOUS HCL 2 % MT SOLN: 15.0000 mL | Freq: Once | OROMUCOSAL | Status: DC

## 2021-12-03 NOTE — ED Notes (Signed)
Patient transported to Imaging at this Time. 

## 2021-12-03 NOTE — ED Notes (Signed)
RN provided AVS using Teachback Method. Patient verbalizes understanding of Discharge Instructions. Opportunity for Questioning and Answers were provided by RN. Patient Discharged from ED ambulatory to Home with family.  

## 2021-12-03 NOTE — ED Provider Notes (Incomplete)
[facility]   CSN: [id number] Arrival date & time: 12/03/21  1418     History {Add pertinent medical, surgical, social history, OB history to HPI:1} Chief Complaint  Patient presents with  . Emesis    Christina BostonHeather Eckley is a 41 y.o. female with past medical history significant for recurrent kidney stones who presents with 1 day of cute onset nausea, vomiting starting yesterday.  Patient reports that she was eating salmon at dinner, did not have any different appetizers in her family.  Symptoms started a few hours later with diarrhea and vomiting.  She denies hematemesis, hematochezia.  She reports that after severe vomiting episode she had sharp central chest pain that radiated to the back.  She denies any history of intra-abdominal surgery, gallbladder issues.  She denies heavy drinking, smoking.  She denies chest pain worse with exertion, radiation to the arms or neck.  She reports that she had a full cardiac work-up in regards to inverted T waves a few years ago with no significant findings, and unremarkable stress test.  She denies any shortness of breath at this time.  He denies dysuria, hematuria, dyspareunia, vaginal discharge.   Emesis     Home Medications Prior to Admission medications   Medication Sig Start Date End Date Taking? Authorizing Provider  Cholecalciferol 25 MCG (1000 UT) tablet Take by mouth. Patient not taking: Reported on 03/23/2021    [provider]  famotidine (PEPCID) 20 MG tablet TAKE 1 TABLET(20 MG) BY MOUTH TWICE DAILY 08/07/20   [provider]  gabapentin (NEURONTIN) 600 MG tablet Take 600 mg by mouth 2 (two) times daily.  04/25/15   [provider]  HYDROmorphone (DILAUDID) 2 MG tablet Take 1 tablet (2 mg total) by mouth every 4 (four) hours as needed for severe pain. Patient not taking: Reported on 03/23/2021 10/09/20   Pricilla LovelessGoldston, Scott, MD  methocarbamol (ROBAXIN) 750 MG tablet Take 750 mg  by mouth 3 (three) times daily. 08/07/20   [provider]  ondansetron (ZOFRAN ODT) 4 MG disintegrating tablet Take 1 tablet (4 mg total) by mouth every 8 (eight) hours as needed for nausea or vomiting. Patient not taking: Reported on 03/23/2021 10/09/20   Pricilla LovelessGoldston, Scott, MD      Allergies    Biaxin [clarithromycin], Penicillins, and Tramadol    Review of Systems   Review of Systems  Gastrointestinal:  Positive for nausea and vomiting.  All other systems reviewed and are negative.  Physical Exam Updated Vital Signs BP 126/90 (BP Location: Right Arm)   Pulse (!) 53   Temp 98.5 F (36.9 C)   Resp 18   Ht 5\' 4"  (1.626 m)   Wt 52.6 kg   LMP 11/30/2021   SpO2 100%   BMI 19.91 kg/m  Physical Exam Vitals and nursing note reviewed.  Constitutional:      General: She is not in acute distress.    Appearance: Normal appearance.  HENT:     Head: Normocephalic and atraumatic.  Eyes:     General:        Right eye: No discharge.        Left eye: No discharge.  Cardiovascular:     Rate and Rhythm: Normal rate and regular rhythm.     Heart sounds: No murmur heard.   No friction rub. No gallop.  Pulmonary:     Effort: Pulmonary effort is normal.     Breath sounds: Normal breath sounds.  Abdominal:  General: Bowel sounds are normal.     Palpations: Abdomen is soft.     Comments: Some tenderness to palpation focally in the right upper quadrant, no rebound, rigidity, guarding.  No significant tenderness throughout the rest of the abdomen.  No CVA tenderness bilaterally.  Musculoskeletal:     Comments: Some tenderness palpation paraspinous muscles on the right, no significant tenderness palpation cervical, thoracic, lumbar spines in the midline.  Intact strength 5 out of 5 bilateral upper and lower extremities.  Skin:    General: Skin is warm and dry.     Capillary Refill: Capillary refill takes less than 2 seconds.  Neurological:     Mental Status: She is alert and  oriented to person, place, and time.  Psychiatric:        Mood and Affect: Mood normal.        Behavior: Behavior normal.    ED Results / Procedures / Treatments   Labs (all labs ordered are listed, but only abnormal results are displayed) Labs Reviewed  LIPASE, BLOOD  COMPREHENSIVE METABOLIC PANEL  CBC  URINALYSIS, ROUTINE W REFLEX MICROSCOPIC  PREGNANCY, URINE    EKG None  Radiology No results found.  Procedures Procedures  {Document cardiac monitor, telemetry assessment procedure when appropriate:1}  Medications Ordered in ED Medications  morphine (PF) 4 MG/ML injection 4 mg (has no administration in time range)  ondansetron (ZOFRAN-ODT) disintegrating tablet 4 mg (4 mg Oral Given 12/03/21 1435)    ED Course/ Medical Decision Making/ A&P                           Medical Decision Making Amount and/or Complexity of Data Reviewed Labs: ordered. Radiology: ordered.  Risk Prescription drug management.   This patient is a 41 y.o. female who presents to the ED for concern of acute onset nausea, vomiting, diarrhea for 1 day with chest pain, radiating to the back, this involves an extensive number of treatment options, and is a complaint that carries with it a high risk of complications and morbidity. The emergent differential diagnosis prior to evaluation includes, but is not limited to, esophageal perforation, Boerhaave's, Mallory-Weiss, gastroenteritis, food poisoning, acute hepatitis,.   This is not an exhaustive differential.   Past Medical History / Co-morbidities / Social History: ***  Additional history: Chart reviewed. Pertinent results include: ***  Physical Exam: Physical exam performed. The pertinent findings include: ***  Lab Tests: I ordered, and personally interpreted labs.  The pertinent results include:  ***   Imaging Studies: I ordered imaging studies including ***. I independently visualized and interpreted imaging which showed ***. I agree  with the radiologist interpretation.   Cardiac Monitoring:  The patient was maintained on a cardiac monitor.  My attending physician Dr. Marland Kitchen viewed and interpreted the cardiac monitored which showed an underlying rhythm of: ***   Medications: I ordered medication including ***  for ***. Reevaluation of the patient after these medicines showed that the patient {resolved/improved/worsened:23923::"improved"}. I have reviewed the patients home medicines and have made adjustments as needed.  Consultations Obtained: I requested consultation with the ***,  and discussed lab and imaging findings as well as pertinent plan - they recommend: ***   Disposition: After consideration of the diagnostic results and the patients response to treatment, I feel that *** .   I discussed this case with my attending physician Dr. Marland Kitchen who cosigned this note including patient's presenting symptoms, physical exam, and planned diagnostics and  interventions. Attending physician stated agreement with plan or made changes to plan which were implemented.   acetam Final Clinical Impression(s) / ED Diagnoses Final diagnoses:  None    Rx / DC Orders ED Discharge Orders     None

## 2021-12-03 NOTE — ED Provider Notes (Signed)
[facility] EMERGENCY DEPT Provider Note   CSN: [id number] Arrival date & time: 12/03/21  1418     History  Chief Complaint  Patient presents with   Emesis    Christina Stone is a 41 y.o. female with past medical history significant for recurrent kidney stones who presents with 1 day of cute onset nausea, vomiting starting yesterday.  Patient reports that she was eating salmon at dinner, did not have any different appetizers in her family.  Symptoms started a few hours later with diarrhea and vomiting.  She denies hematemesis, hematochezia.  She reports that after severe vomiting episode she had sharp central chest pain that radiated to the back.  She denies any history of intra-abdominal surgery, gallbladder issues.  She denies heavy drinking, smoking.  She denies chest pain worse with exertion, radiation to the arms or neck.  She reports that she had a full cardiac work-up in regards to inverted T waves a few years ago with no significant findings, and unremarkable stress test.  She denies any shortness of breath at this time.  He denies dysuria, hematuria, dyspareunia, vaginal discharge.   Emesis     Home Medications Prior to Admission medications   Medication Sig Start Date End Date Taking? Authorizing Provider  ondansetron (ZOFRAN) 4 MG tablet Take 1 tablet (4 mg total) by mouth every 6 (six) hours. 12/03/21  Yes Vesna Kable H, PA-C  famotidine (PEPCID) 20 MG tablet TAKE 1 TABLET(20 MG) BY MOUTH TWICE DAILY 08/07/20   [provider]  gabapentin (NEURONTIN) 600 MG tablet Take 600 mg by mouth 2 (two) times daily.  04/25/15   [provider]  HYDROmorphone (DILAUDID) 2 MG tablet Take 1 tablet (2 mg total) by mouth every 4 (four) hours as needed for severe pain. Patient not taking: Reported on 03/23/2021 10/09/20   Pricilla Loveless, MD  methocarbamol (ROBAXIN) 750 MG tablet Take 750 mg by mouth 3 (three) times daily. 08/07/20   [provider]   ondansetron (ZOFRAN ODT) 4 MG disintegrating tablet Take 1 tablet (4 mg total) by mouth every 8 (eight) hours as needed for nausea or vomiting. Patient not taking: Reported on 03/23/2021 10/09/20   Pricilla Loveless, MD      Allergies    Biaxin [clarithromycin], Penicillins, and Tramadol    Review of Systems   Review of Systems  Gastrointestinal:  Positive for nausea and vomiting.  All other systems reviewed and are negative.  Physical Exam Updated Vital Signs BP 126/76 (BP Location: Right Arm)   Pulse (!) 45   Temp 98.5 F (36.9 C)   Resp 16   Ht 5\' 4"  (1.626 m)   Wt 52.6 kg   LMP 11/30/2021 Comment: U-Preg Negative 12/03/21  SpO2 97%   BMI 19.91 kg/m  Physical Exam Vitals and nursing note reviewed.  Constitutional:      General: She is not in acute distress.    Appearance: Normal appearance.  HENT:     Head: Normocephalic and atraumatic.  Eyes:     General:        Right eye: No discharge.        Left eye: No discharge.  Cardiovascular:     Rate and Rhythm: Normal rate and regular rhythm.     Heart sounds: No murmur heard.   No friction rub. No gallop.  Pulmonary:     Effort: Pulmonary effort is normal.     Breath sounds: Normal breath sounds.  Abdominal:     General:  Bowel sounds are normal.     Palpations: Abdomen is soft.     Comments: Some tenderness to palpation focally in the right upper quadrant, no rebound, rigidity, guarding.  No significant tenderness throughout the rest of the abdomen.  No CVA tenderness bilaterally.   Musculoskeletal:     Comments: Some tenderness to palpation paraspinous muscles on the right, no significant tenderness palpation cervical, thoracic, lumbar spines in the midline.  Intact strength 5 out of 5 bilateral upper and lower extremities.   Skin:    General: Skin is warm and dry.     Capillary Refill: Capillary refill takes less than 2 seconds.  Neurological:     Mental Status: She is alert and oriented to person, place, and time.   Psychiatric:        Mood and Affect: Mood normal.        Behavior: Behavior normal.    ED Results / Procedures / Treatments   Labs (all labs ordered are listed, but only abnormal results are displayed) Labs Reviewed  COMPREHENSIVE METABOLIC PANEL - Abnormal; Notable for the following components:      Result Value   Glucose, Bld 106 (*)    AST 275 (*)    ALT 522 (*)    All other components within normal limits  CBC - Abnormal; Notable for the following components:   Hemoglobin 11.7 (*)    Platelets 413 (*)    All other components within normal limits  URINALYSIS, ROUTINE W REFLEX MICROSCOPIC - Abnormal; Notable for the following components:   Specific Gravity, Urine 1.038 (*)    Hgb urine dipstick MODERATE (*)    Ketones, ur 40 (*)    Protein, ur 30 (*)    All other components within normal limits  LIPASE, BLOOD  PREGNANCY, URINE  HEPATITIS PANEL, ACUTE    EKG None  Radiology CT CHEST ABDOMEN PELVIS W CONTRAST  Result Date: 12/03/2021 CLINICAL DATA:  Abdominal pain, acute, nonlocalized EXAM: CT CHEST, ABDOMEN, AND PELVIS WITH CONTRAST TECHNIQUE: Multidetector CT imaging of the chest, abdomen and pelvis was performed following the standard protocol during bolus administration of intravenous contrast. RADIATION DOSE REDUCTION: This exam was performed according to the departmental dose-optimization program which includes automated exposure control, adjustment of the mA and/or kV according to patient size and/or use of iterative reconstruction technique. CONTRAST:  30mL OMNIPAQUE IOHEXOL 300 MG/ML  SOLN COMPARISON:  None Available. FINDINGS: CT CHEST FINDINGS Cardiovascular: Normal heart size. No pericardial effusion. Thoracic aorta is normal in caliber. Mediastinum/Nodes: Few small calcified mediastinal lymph nodes reflecting prior granulomatous disease. No enlarged nodes identified. Esophagus and thyroid are unremarkable. Lungs/Pleura: Central airways are patent. There is no  consolidation or mass. No pleural effusion. Musculoskeletal: No acute osseous abnormality. CT ABDOMEN PELVIS FINDINGS Hepatobiliary: Small cyst of the peripheral right hepatic lobe. Gallbladder is unremarkable. No biliary dilatation. Pancreas: Unremarkable. No pancreatic ductal dilatation or surrounding inflammatory changes. Spleen: Normal in size without focal abnormality. Adrenals/Urinary Tract: Adrenals are unremarkable. Kidneys are unremarkable. The bladder is poorly distended but unremarkable. Stomach/Bowel: Stomach is within normal limits. There is some residual contrast within the right colon. Normal appendix. Vascular/Lymphatic: Trace calcified plaque.  No enlarged nodes. Reproductive: Uterus and bilateral adnexa are unremarkable. Other: No free fluid.  Abdominal wall is unremarkable. Musculoskeletal: Mild lower lumbar degenerative disc disease. No acute osseous abnormality. IMPRESSION: No acute abnormality. Electronically Signed   By: Guadlupe Spanish M.D.   On: 12/03/2021 16:36   US Abdomen Limited RUQ (LIVER/GB)  Result Date: 12/03/2021 CLINICAL DATA:  Nausea vomiting diarrhea.  Abdominal pain EXAM: ULTRASOUND ABDOMEN LIMITED RIGHT UPPER QUADRANT COMPARISON:  CT head 10/09/2020 FINDINGS: Gallbladder: No gallstones or wall thickening visualized. No sonographic Murphy sign noted by sonographer. Common bile duct: Diameter: 2.8 mm Liver: 15 mm complex cyst in the right lobe liver laterally. This contains an internal septation. No change from prior CT. Portal vein is patent on color Doppler imaging with normal direction of blood flow towards the liver. Other: None. IMPRESSION: Negative for gallstones or biliary dilatation Small right renal cyst stable Electronically Signed   By: Marlan Palauharles  Clark M.D.   On: 12/03/2021 15:51    Procedures Procedures    Medications Ordered in ED Medications  ondansetron (ZOFRAN-ODT) disintegrating tablet 4 mg (4 mg Oral Given 12/03/21 1435)  morphine (PF) 4 MG/ML  injection 4 mg (4 mg Intravenous Given 12/03/21 1518)  iohexol (OMNIPAQUE) 300 MG/ML solution 100 mL (60 mLs Intravenous Contrast Given 12/03/21 1614)    ED Course/ Medical Decision Making/ A&P                           Medical Decision Making Amount and/or Complexity of Data Reviewed Labs: ordered. Radiology: ordered.  Risk Prescription drug management.   This patient is a 41 y.o. female who presents to the ED for concern of acute onset nausea, vomiting, diarrhea for 1 day with chest pain, radiating to the back, this involves an extensive number of treatment options, and is a complaint that carries with it a high risk of complications and morbidity. The emergent differential diagnosis prior to evaluation includes, but is not limited to, esophageal perforation, Boerhaave's, Mallory-Weiss, gastroenteritis, food poisoning, acute hepatitis, ACS, AAS, PE versus other.  Do have lower clinical suspicion for acute cardiac or pulmonary abnormality based on patient description of pain, onset in context of acute nausea and vomiting, and resolution after morphine x1.  Patient's pain is not exertional in nature.   This is not an exhaustive differential.    Past Medical History / Co-morbidities / Social History: History of kidney stones, hypertension, anemia   Additional history: Chart reviewed. Pertinent results include: Reviewed outpatient urgent care, family medicine, lab work and imaging from previous emergency department visits   Physical Exam: Physical exam performed. The pertinent findings include: Patient with some tenderness palpation of the right upper quadrant, otherwise unremarkable abdominal exam.  No significant scleral icterus or other notable jaundice.   Lab Tests: I ordered, and personally interpreted labs.  The pertinent results include: CBC overall unremarkable with mild anemia, hemoglobin 11.7.  Platelets mildly elevated 413, favor acute phase reactant.  Lipase normal.  Pregnancy  test is negative.  Urinalysis with moderate hemoglobin, some ketones and protein as well as high specific gravity suggestive of mild dehydration.  CMP notable for significant transaminitis, AST 275, ALT 522.  Given these elevations I have high clinical suspicion for hepatitis of unknown etiology.  Differential diagnosis includes viral hepatitis, autoimmune hepatitis, Wilson's disease, alpha-1 antitrypsin days,   Imaging Studies: I ordered imaging studies including CT chest abdomen pelvis with contrast, as well as right upper quadrant ultrasound. I independently visualized and interpreted imaging which showed does have a cyst of the liver with internal septation which is unchanged compared to previous imaging, no acute changes noted of the gallbladder, bile duct, or other intra-abdominal abnormalities noted.  Notably no evidence of esophageal perforation or other intrathoracic abnormality.. I agree with the radiologist interpretation.  Cardiac Monitoring:  The patient was maintained on a cardiac monitor.  My attending physician Dr. Rubin Payor viewed and interpreted the cardiac monitored which showed an underlying rhythm of: Normal sinus rhythm, occasional sinus bradycardia without evidence of heart block   Medications: I ordered medication including morphine, Zofran for pain, nausea. Reevaluation of the patient after these medicines showed that the patient improved. I have reviewed the patients home medicines and have made adjustments as needed.  We will discharge with Zofran for nausea at home.  Discussed additional medication for abdominal pain, fluid resuscitation in the emergency department, however patient reports that she feels significantly better after morphine, Zofran x1 and declines additional medication at this time.   Disposition: After consideration of the diagnostic results and the patients response to treatment, I feel that I have high clinical suspicion for acute hepatitis based on  patient's transaminitis and otherwise unremarkable evaluation today.  We will obtain hepatitis panel.  Patient is a good historian denies any toxic ingestion, or acetaminophen usage prior to arrival.  Discussed she will need close GI/PCP follow-up based on results of her hepatitis panel.  In the meantime encouraged plenty of fluids, rest, and discussed extensive return precautions for worsening abdominal pain, jaundice, fever, chills.  Patient understands agrees to this plan, discharged in stable condition at this time.    I discussed this case with my attending physician Dr. Rubin Payor who cosigned this note including patient's presenting symptoms, physical exam, and planned diagnostics and interventions. Attending physician stated agreement with plan or made changes to plan which were implemented.   acetam Final Clinical Impression(s) / ED Diagnoses Final diagnoses:  Nausea, vomiting, and diarrhea  Transaminitis    Rx / DC Orders ED Discharge Orders          Ordered    Ambulatory referral to Gastroenterology       Comments: Marked transaminitis, follow up for hepatitis evaluation vs. other   12/03/21 1657    ondansetron (ZOFRAN) 4 MG tablet  Every 6 hours        12/03/21 1657              Carolie Mcilrath, Harrel Carina, PA-C 12/03/21 1759    Benjiman Core, MD 12/03/21 2133

## 2021-12-03 NOTE — Discharge Instructions (Addendum)
Your lab work today was notable for elevated liver enzymes. For your records your AST was 275 (normal range 15-41) and ALT was 522 (0-44). Normally when these enzymes are so high, it raises our suspicion for some kind of hepatitis which is a liver infection caused by a virus or other cause. There are a few different kinds of viral hepatitis, including Hepatitis B and C which are caused by blood to blood contact, as well as Hepatitis A which is often caused by food contamination. This can also be caused by tylenol toxicity, other viral infections, certain autoimmune conditions, and a few other rare conditions like we discussed.   While we are waiting to see what your test results say, I would avoid tylenol as this can cause further liver damage. Otherwise you do not need to do anything in particular other than drink plenty of fluids, take nausea medication as needed.   If you have any concerns for worsening symptoms please return to the emergency department for further evaluation.

## 2021-12-03 NOTE — ED Triage Notes (Signed)
Pt c/o N/V/D that started last night and today pt has chest pain that radiates to the back. Pt reports she has vomited 10+ in the last 24 hours.

## 2021-12-03 NOTE — ED Notes (Signed)
Patient returned from Imaging.

## 2022-01-16 ENCOUNTER — Other Ambulatory Visit: Payer: Self-pay

## 2022-01-16 ENCOUNTER — Emergency Department (HOSPITAL_BASED_OUTPATIENT_CLINIC_OR_DEPARTMENT_OTHER)
Admission: EM | Admit: 2022-01-16 | Discharge: 2022-01-16 | Disposition: A | Discharge status: Home / Self Care | Attending: Emergency Medicine | Admitting: Emergency Medicine

## 2022-01-16 ENCOUNTER — Encounter (HOSPITAL_BASED_OUTPATIENT_CLINIC_OR_DEPARTMENT_OTHER): Payer: Self-pay

## 2022-01-16 ENCOUNTER — Emergency Department (HOSPITAL_BASED_OUTPATIENT_CLINIC_OR_DEPARTMENT_OTHER)

## 2022-01-16 DIAGNOSIS — R112 Nausea with vomiting, unspecified: Secondary | ICD-10-CM | POA: Insufficient documentation

## 2022-01-16 DIAGNOSIS — R1013 Epigastric pain: Secondary | ICD-10-CM | POA: Insufficient documentation

## 2022-01-16 LAB — CBC
HCT: 36.9 % (ref 36.0–46.0)
Hemoglobin: 12.4 g/dL (ref 12.0–15.0)
MCH: 28.4 pg (ref 26.0–34.0)
MCHC: 33.6 g/dL (ref 30.0–36.0)
MCV: 84.4 fL (ref 80.0–100.0)
Platelets: 338 10*3/uL (ref 150–400)
RBC: 4.37 MIL/uL (ref 3.87–5.11)
RDW: 13.3 % (ref 11.5–15.5)
WBC: 8.4 10*3/uL (ref 4.0–10.5)
nRBC: 0 % (ref 0.0–0.2)

## 2022-01-16 LAB — COMPREHENSIVE METABOLIC PANEL
ALT: 14 U/L (ref 0–44)
AST: 21 U/L (ref 15–41)
Albumin: 5 g/dL (ref 3.5–5.0)
Alkaline Phosphatase: 41 U/L (ref 38–126)
Anion gap: 17 — ABNORMAL HIGH (ref 5–15)
BUN: 11 mg/dL (ref 6–20)
CO2: 20 mmol/L — ABNORMAL LOW (ref 22–32)
Calcium: 10.3 mg/dL (ref 8.9–10.3)
Chloride: 101 mmol/L (ref 98–111)
Creatinine, Ser: 0.81 mg/dL (ref 0.44–1.00)
GFR, Estimated: >60 mL/min (ref >60)
Glucose, Bld: 155 mg/dL — ABNORMAL HIGH (ref 70–99)
Potassium: 4.4 mmol/L (ref 3.5–5.1)
Sodium: 138 mmol/L (ref 135–145)
Total Bilirubin: 0.6 mg/dL (ref 0.3–1.2)
Total Protein: 7.8 g/dL (ref 6.5–8.1)

## 2022-01-16 LAB — HCG, SERUM, QUALITATIVE: Preg, Serum: NEGATIVE

## 2022-01-16 LAB — LIPASE, BLOOD: Lipase: 27 U/L (ref 11–51)

## 2022-01-16 MED ORDER — PANTOPRAZOLE SODIUM 40 MG IV SOLR
40.0000 mg | Freq: Once | INTRAVENOUS | Status: AC
  Administered 2022-01-16: 40 mg via INTRAVENOUS
  Filled 2022-01-16: qty 10

## 2022-01-16 MED ORDER — ONDANSETRON HCL 4 MG/2ML IJ SOLN
4.0000 mg | Freq: Once | INTRAMUSCULAR | Status: AC
  Administered 2022-01-16: 4 mg via INTRAVENOUS
  Filled 2022-01-16: qty 2

## 2022-01-16 MED ORDER — PROMETHAZINE HCL 25 MG/ML IJ SOLN
INTRAMUSCULAR | Status: AC
  Filled 2022-01-16: qty 1

## 2022-01-16 MED ORDER — SODIUM CHLORIDE 0.9 % IV SOLN
25.0000 mg | Freq: Four times a day (QID) | INTRAVENOUS | Status: DC | PRN
  Filled 2022-01-16: qty 1

## 2022-01-16 MED ORDER — PROCHLORPERAZINE EDISYLATE 10 MG/2ML IJ SOLN: 10.0000 mg | Freq: Once | INTRAMUSCULAR | Status: DC

## 2022-01-16 MED ORDER — PROMETHAZINE HCL 25 MG PO TABS: 25.0000 mg | ORAL_TABLET | Freq: Four times a day (QID) | ORAL | 0 refills | Status: DC | PRN

## 2022-01-16 MED ORDER — FENTANYL CITRATE PF 50 MCG/ML IJ SOSY
50.0000 ug | PREFILLED_SYRINGE | Freq: Once | INTRAMUSCULAR | Status: AC
  Administered 2022-01-16: 50 ug via INTRAVENOUS
  Filled 2022-01-16: qty 1

## 2022-01-16 MED ORDER — SODIUM CHLORIDE 0.9 % IV BOLUS
1000.0000 mL | Freq: Once | INTRAVENOUS | Status: AC
Start: 2022-01-16 — End: 2022-01-16
  Administered 2022-01-16: 1000 mL via INTRAVENOUS

## 2022-01-16 MED ORDER — IOHEXOL 300 MG/ML  SOLN
100.0000 mL | Freq: Once | INTRAMUSCULAR | Status: AC | PRN
  Administered 2022-01-16: 80 mL via INTRAVENOUS

## 2022-01-16 NOTE — ED Notes (Signed)
Dc instructions reviewed with patient. Patient voiced understanding. Dc with belongings.  °

## 2022-01-16 NOTE — ED Provider Notes (Signed)
[facility] EMERGENCY DEPT Provider Note   CSN: [id number] Arrival date & time: 01/16/22  0626     History  Chief Complaint  Patient presents with   Abdominal Pain    Christina Stone is a 41 y.o. female.  The history is provided by the patient.  Abdominal Pain Pain location:  Epigastric Pain quality: aching   Pain radiates to:  Does not radiate Pain severity:  Mild Onset quality:  Gradual Duration:  1 day Timing:  Constant Progression:  Unchanged Chronicity:  New Context: eating   Context: not previous surgeries   Relieved by:  Nothing Worsened by:  Nothing Associated symptoms: chest pain, nausea, shortness of breath and vomiting   Associated symptoms: no anorexia, no belching, no chills, no constipation, no cough, no diarrhea, no dysuria, no fatigue, no fever, no flatus, no hematemesis, no hematochezia, no hematuria, no melena, no sore throat, no vaginal bleeding and no vaginal discharge        Home Medications Prior to Admission medications   Medication Sig Start Date End Date Taking? Authorizing Provider  promethazine (PHENERGAN) 25 MG tablet Take 1 tablet (25 mg total) by mouth every 6 (six) hours as needed for up to 20 days for nausea or vomiting. 01/16/22 02/05/22 Yes Leverne Tessler, DO  famotidine (PEPCID) 20 MG tablet TAKE 1 TABLET(20 MG) BY MOUTH TWICE DAILY 08/07/20   [provider]  gabapentin (NEURONTIN) 600 MG tablet Take 600 mg by mouth 2 (two) times daily.  04/25/15   [provider]  HYDROmorphone (DILAUDID) 2 MG tablet Take 1 tablet (2 mg total) by mouth every 4 (four) hours as needed for severe pain. Patient not taking: Reported on 03/23/2021 10/09/20   Pricilla Loveless, MD  methocarbamol (ROBAXIN) 750 MG tablet Take 750 mg by mouth 3 (three) times daily. 08/07/20   [provider]  ondansetron (ZOFRAN ODT) 4 MG disintegrating tablet Take 1 tablet (4 mg total) by mouth every 8 (eight) hours as needed for nausea or  vomiting. Patient not taking: Reported on 03/23/2021 10/09/20   Pricilla Loveless, MD  ondansetron (ZOFRAN) 4 MG tablet Take 1 tablet (4 mg total) by mouth every 6 (six) hours. 12/03/21   Prosperi, Christian H, PA-C      Allergies    Biaxin [clarithromycin], Penicillins, and Tramadol    Review of Systems   Review of Systems  Constitutional:  Negative for chills, fatigue and fever.  HENT:  Negative for sore throat.   Respiratory:  Positive for shortness of breath. Negative for cough.   Cardiovascular:  Positive for chest pain.  Gastrointestinal:  Positive for abdominal pain, nausea and vomiting. Negative for anorexia, constipation, diarrhea, flatus, hematemesis, hematochezia and melena.  Genitourinary:  Negative for dysuria, hematuria, vaginal bleeding and vaginal discharge.    Physical Exam Updated Vital Signs BP (!) 167/89 (BP Location: Right Arm)   Pulse (!) 51   Temp 97.7 F (36.5 C)   Resp 15   Ht 5\' 4"  (1.626 m)   Wt 49.9 kg   SpO2 100%   BMI 18.88 kg/m  Physical Exam Vitals and nursing note reviewed.  Constitutional:      General: She is not in acute distress.    Appearance: She is well-developed. She is not ill-appearing.  HENT:     Head: Normocephalic and atraumatic.  Eyes:     Conjunctiva/sclera: Conjunctivae normal.  Cardiovascular:     Rate and Rhythm: Normal rate and regular rhythm.     Heart sounds: Normal  heart sounds. No murmur heard. Pulmonary:     Effort: Pulmonary effort is normal. No respiratory distress.     Breath sounds: Normal breath sounds.  Abdominal:     Palpations: Abdomen is soft.     Tenderness: There is abdominal tenderness.  Musculoskeletal:        General: No swelling.     Cervical back: Neck supple.  Skin:    General: Skin is warm and dry.     Capillary Refill: Capillary refill takes less than 2 seconds.  Neurological:     General: No focal deficit present.     Mental Status: She is alert.  Psychiatric:        Mood and Affect: Mood  normal.     ED Results / Procedures / Treatments   Labs (all labs ordered are listed, but only abnormal results are displayed) Labs Reviewed  COMPREHENSIVE METABOLIC PANEL - Abnormal; Notable for the following components:      Result Value   CO2 20 (*)    Glucose, Bld 155 (*)    Anion gap 17 (*)    All other components within normal limits  LIPASE, BLOOD  CBC  HCG, SERUM, QUALITATIVE    EKG EKG Interpretation  Date/Time:  Saturday January 16 2022 06:33:17 EDT Ventricular Rate:  60 PR Interval:  129 QRS Duration: 78 QT Interval:  485 QTC Calculation: 485 R Axis:   79 Text Interpretation: Sinus rhythm Right atrial enlargement Consider left ventricular hypertrophy No significant change was found Confirmed by Paula Libra ([id number]) on 01/16/2022 7:00:42 AM  Radiology CT ABDOMEN PELVIS W CONTRAST  Result Date: 01/16/2022 CLINICAL DATA:  Abdominal pain. EXAM: CT ABDOMEN AND PELVIS WITH CONTRAST TECHNIQUE: Multidetector CT imaging of the abdomen and pelvis was performed using the standard protocol following bolus administration of intravenous contrast. RADIATION DOSE REDUCTION: This exam was performed according to the departmental dose-optimization program which includes automated exposure control, adjustment of the mA and/or kV according to patient size and/or use of iterative reconstruction technique. CONTRAST:  39mL OMNIPAQUE IOHEXOL 300 MG/ML  SOLN COMPARISON:  12/03/2021 FINDINGS: Lower chest: No acute abnormality. Hepatobiliary: There is a simple appearing cyst within the periphery of the right hepatic lobe measuring 1.1 cm. A few scattered subcentimeter low-density foci are noted within the dome of liver which appears similar to the previous exam and are too small to reliably characterize. There is mild heterogeneous enhancement of the liver parenchyma with diffuse periportal edema. Gallbladder appears decompressed. No gallstones, gallbladder wall thickening or bile duct dilatation.  Pancreas: Unremarkable. No pancreatic ductal dilatation or surrounding inflammatory changes. Spleen: Normal in size without focal abnormality. Adrenals/Urinary Tract: Normal adrenal glands. No nephrolithiasis, hydronephrosis or suspicious mass within the kidneys. Urinary bladder appears within normal limits. Stomach/Bowel: Stomach appears normal. The appendix is visualized and appears normal. No signs of bowel wall thickening, inflammation, or distension. Vascular/Lymphatic: Normal appearance of the abdominal aorta. No abdominopelvic adenopathy. Reproductive: Uterus appears normal. Corpus luteum identified within the right ovary measuring 1.8 cm. Increased parametrial vascularity is identified which may reflect pelvic venous congestion. Other: Trace fluid noted within the dependent portion of the pelvis on the right. No focal fluid collections identified. No signs of pneumoperitoneum. Musculoskeletal: No acute or significant osseous findings. Marked degenerative disc disease identified at L5-S1. No acute or suspicious osseous findings. IMPRESSION: 1. No acute findings identified within the abdomen or pelvis. 2. Periportal edema and mild heterogeneous enhancement of the liver. Correlate with lab values to assess for any  clinical signs or symptoms of acute hepatic biliary disease. 3. Increased parametrial vascularity is identified which may reflect pelvic venous congestion. 4. Trace fluid noted within the dependent portion of the pelvis on the right. Nonspecific in a premenopausal female. Electronically Signed   By: Signa Kell M.D.   On: 01/16/2022 09:04    Procedures Procedures    Medications Ordered in ED Medications  sodium chloride 0.9 % bolus 1,000 mL (1,000 mLs Intravenous New Bag/Given 01/16/22 0732)  ondansetron (ZOFRAN) injection 4 mg (4 mg Intravenous Given 01/16/22 0729)  fentaNYL (SUBLIMAZE) injection 50 mcg (50 mcg Intravenous Given 01/16/22 0729)  iohexol (OMNIPAQUE) 300 MG/ML solution 100 mL  (80 mLs Intravenous Contrast Given 01/16/22 0833)  pantoprazole (PROTONIX) injection 40 mg (40 mg Intravenous Given 01/16/22 0845)    ED Course/ Medical Decision Making/ A&P                           Medical Decision Making Amount and/or Complexity of Data Reviewed Labs: ordered. Radiology: ordered.  Risk Prescription drug management.   Christina Stone is here with abdominal pain.  History of kidney stones.  Upper abdominal pain since overnight with nausea and vomiting.  Burning in her chest.  Not sure if she ate something to make her sick.  Nothing makes it worse or better.  Differential diagnosis is gastritis versus pancreatitis versus cholecystitis.  I have no concern for ACS or PE.  Patient with EKG per my review and interpretation shows sinus rhythm.  No ischemic changes.  We will get CBC, CMP, lipase, urinalysis, CT scan abdomen pelvis.  Will give IV fluids, IV fentanyl, IV Zofran and reevaluate.  Per review and interpretation of labs there is no significant anemia, electrolyte abnormality, kidney injury, leukocytosis.  Gallbladder and liver enzymes within normal limits.  Lipase normal.  CT scan per radiology report shows no acute findings.  Some nonspecific periportal edema but gallbladder and liver enzymes within normal limits.  There are no gallstones or gallbladder wall thickening.  Overall this is nonspecific.  She also has some signs to maybe suggest pelvic venous congestion as well.  Overall she is feeling better after IV fluids and antiemetics.  Will prescribe antiemetics.  Understands return precautions and discharged in the ED in good condition.  This chart was dictated using voice recognition software.  Despite best efforts to proofread,  errors can occur which can change the documentation meaning.         Final Clinical Impression(s) / ED Diagnoses Final diagnoses:  Nausea and vomiting, unspecified vomiting type    Rx / DC Orders ED Discharge Orders          Ordered     promethazine (PHENERGAN) 25 MG tablet  Every 6 hours PRN        01/16/22 0913              Virgina Norfolk, DO 01/16/22 0915

## 2022-01-16 NOTE — ED Triage Notes (Signed)
Presented via ED with c/o abd pain with n/v that started last night. Denies diarrhea; denies fever.  Pt admits to now having sob and cp that started this AM.

## 2022-01-16 NOTE — ED Notes (Signed)
Patient transported to CT 

## 2022-03-25 ENCOUNTER — Ambulatory Visit: Admitting: Obstetrics and Gynecology

## 2022-03-29 NOTE — Progress Notes (Unsigned)
ANNUAL EXAM Patient name: Christina Stone MRN [id number]  Date of birth: 05/03/1981 Chief Complaint:   No chief complaint on file.  History of Present Illness:   Christina Stone is a 41 y.o. [id number] female being seen today for a routine annual exam.   Current complaints: She has started having symptoms of menopause including mood swings as the worst of her symptoms. Periods are still largely normal.   Patient's last menstrual period was 03/18/2022.  Current birth control: NFP but she would like tubal. She gave her husband one year to get a vasectomy and he did not go so she would like to proceed. She does not want any more children.   Last pap:  04/2018 - pap wnl, hpv neg 03/2021 - LSIL, HPV neg  Last MXR: 12/11/2021 at [facility] Maintenance Due  Topic Date Due   COVID-19 Vaccine (1) Never done   HIV Screening  Never done   INFLUENZA VACCINE  02/09/2022    Review of Systems:   Pertinent items are noted in HPI Denies any headaches, blurred vision, fatigue, shortness of breath, chest pain, abdominal pain, abnormal vaginal discharge/itching/odor/irritation, problems with periods, bowel movements, urination, or intercourse unless otherwise stated above.  Pertinent History Reviewed:  Reviewed past medical,surgical, social and family history.  Reviewed problem list, medications and allergies. Physical Assessment:   Vitals:   04/01/22 0804  BP: (!) 129/90  Pulse: 70  Weight: 116 lb (52.6 kg)  Height: _0  (1.626 m)  Body mass index is 19.91 kg/m.   Physical Examination:  General appearance - well appearing, and in no distress Mental status - alert, oriented to person, place, and time Psych:  She has a normal mood and affect Skin - warm and dry, normal color, no suspicious lesions noted Chest - effort normal Heart - normal rate  Breasts - breasts appear normal, no suspicious masses, no skin or nipple changes or axillary nodes Abdomen - soft, nontender, nondistended, no  masses or organomegaly Pelvic -  VULVA: normal appearing vulva with no masses, tenderness or lesions  VAGINA: normal appearing vagina with normal color and discharge, no lesions  CERVIX: normal appearing cervix without discharge or lesions, no CMT UTERUS: uterus is felt to be normal size, shape, consistency and nontender  ADNEXA: No adnexal masses or tenderness noted. Extremities:  No swelling or varicosities noted  Chaperone present for exam  No results found for this or any previous visit (from the past 24 hour(s)).  Assessment & Plan:  Diagnoses and all orders for this visit:  Abnormal cervical Papanicolaou smear, unspecified abnormal pap finding LSIL/HPV neg 03/2021  Encounter for annual routine gynecological examination - Cervical cancer screening: Discussed guidelines. Pap with HPV done - STD Testing: declines - Birth Control: Desires tubal - see below - Breast Health: Encouraged self breast awareness/SBE. Teaching provided. Discussed limits of clinical breast exam for detecting breast cancer. Rx given for MXR - F/U 12 months and prn  -     Cytology - PAP( [facility])  Family history of breast cancer in female BRCA1/2 neg  Unwanted fertility - She desires permanent sterilization. Discussed alternatives including LARC options and vasectomy. She declines these options.  - Discussed surgery of salpingectomy vs tubal ligation. She would like to do a salpingectomy.  - Risks of surgery include but are not limited to: bleeding, infection, injury to surrounding organs/tissues (i.e. bowel/bladder/ureters), need for additional procedures, wound complications, hospital re-admission, and conversion to open surgery, VTE - Reviewed restrictions and  recovery following surgery   Symptoms, such as flushing, sleeplessness, headache, lack of concentration, associated with the menopause - We discussed the treatment options for menopause as well as indications - we discussed both HRT and  non-HRT.  - Discussed the benefits of each and relative effectiveness.  - Discussed goals of therapy i.e. reduction of hot flashes (not complete resolution). Reviewed full response takes up to 2-3 months, including for hormone therapy. - Discussed if HRT we do shortest amount of time at lowest dose. We discussed annual attempts at coming of the HRT typically in the fall months when the weather has cooled down. - We discussed the differences in modes of therapy for HRT -- patch vs oral therapy.  - Discussed risks of HRT: E+P = breast cancer, clotting, MI/Stroke. Discussed risk of E alone. We reviewed that limits of data from the [facility] regarding breast cancer impact - only progesterone used in that study was provera which is biologically active in the best. We MAY reduce that risk by doing a different progesterone based therapy I.e. norethindrone. We reviewed the indication and necessity for progesterone and that estrogen alone in those with a uterus have an increased risk of endometrial cancer - Discussed genitourinary symptoms i.e. urinary issues, vaginal dryness and dyspareunia and that for these symptoms, local treatment is best. - She would like: HRT   Orders Placed This Encounter  Procedures   MM 3D SCREEN BREAST BILATERAL   HM MAMMOGRAPHY    Meds:  Meds ordered this encounter  Medications   estradiol-levonorgestrel (CLIMARAPRO) 0.045-0.015 MG/DAY    Sig: Place 1 patch onto the skin once a week.    Dispense:  4 patch    Refill:  12    Follow-up: Return in about 1 year (around 04/02/2023) for annual.  Radene Gunning, MD 04/01/2022 8:32 AM

## 2022-04-01 ENCOUNTER — Ambulatory Visit (INDEPENDENT_AMBULATORY_CARE_PROVIDER_SITE_OTHER): Admitting: Obstetrics and Gynecology

## 2022-04-01 ENCOUNTER — Encounter: Payer: Self-pay | Admitting: Obstetrics and Gynecology

## 2022-04-01 ENCOUNTER — Other Ambulatory Visit (HOSPITAL_COMMUNITY)
Admission: RE | Admit: 2022-04-01 | Discharge: 2022-04-01 | Disposition: A | Discharge status: Home / Self Care | Source: Ambulatory Visit | Attending: Obstetrics and Gynecology | Admitting: Obstetrics and Gynecology

## 2022-04-01 VITALS — BP 129/90 | HR 70 | Ht 64.0 in | Wt 116.0 lb

## 2022-04-01 DIAGNOSIS — Z803 Family history of malignant neoplasm of breast: Secondary | ICD-10-CM

## 2022-04-01 DIAGNOSIS — Z01419 Encounter for gynecological examination (general) (routine) without abnormal findings: Secondary | ICD-10-CM

## 2022-04-01 DIAGNOSIS — Z3009 Encounter for other general counseling and advice on contraception: Secondary | ICD-10-CM | POA: Diagnosis not present

## 2022-04-01 DIAGNOSIS — N951 Menopausal and female climacteric states: Secondary | ICD-10-CM

## 2022-04-01 DIAGNOSIS — R87619 Unspecified abnormal cytological findings in specimens from cervix uteri: Secondary | ICD-10-CM

## 2022-04-01 MED ORDER — ESTRADIOL-LEVONORGESTREL 0.045-0.015 MG/DAY TD PTWK: 1.0000 | MEDICATED_PATCH | TRANSDERMAL | 12 refills | Status: DC

## 2022-04-05 LAB — CYTOLOGY - PAP
Comment: NEGATIVE
Diagnosis: NEGATIVE
Diagnosis: REACTIVE
High risk HPV: NEGATIVE

## 2022-05-21 ENCOUNTER — Encounter (HOSPITAL_BASED_OUTPATIENT_CLINIC_OR_DEPARTMENT_OTHER): Payer: Self-pay | Admitting: Obstetrics and Gynecology

## 2022-05-24 ENCOUNTER — Encounter (HOSPITAL_BASED_OUTPATIENT_CLINIC_OR_DEPARTMENT_OTHER): Payer: Self-pay | Admitting: Obstetrics and Gynecology

## 2022-05-24 NOTE — Progress Notes (Signed)
Spoke w/ via phone for pre-op interview--- pt Lab needs dos----  cbc, t&s, urine preg             Lab results------ no COVID test -----patient states asymptomatic no test needed Arrive at ------- 1230 on 05-26-2022 NPO after MN NO Solid Food.  Clear liquids from MN until--- 1130 Med rec completed Medications to take morning of surgery ----- gabapentin, robaxin Diabetic medication ----- n/a Patient instructed no nail polish to be worn day of surgery Patient instructed to bring photo id and insurance card day of surgery Patient aware to have Driver (ride ) / caregiver for 24 hours after surgery --- friend, susan Patient Special Instructions ----- n/a Pre-Op special Istructions ----- n/a Patient verbalized understanding of instructions that were given at this phone interview. Patient denies shortness of breath, chest pain, fever, cough at this phone interview.

## 2022-05-26 ENCOUNTER — Other Ambulatory Visit: Payer: Self-pay

## 2022-05-26 ENCOUNTER — Ambulatory Visit (HOSPITAL_BASED_OUTPATIENT_CLINIC_OR_DEPARTMENT_OTHER): Admitting: Anesthesiology

## 2022-05-26 ENCOUNTER — Encounter (HOSPITAL_BASED_OUTPATIENT_CLINIC_OR_DEPARTMENT_OTHER)
Admission: RE | Disposition: A | Discharge status: Home / Self Care | Payer: Self-pay | Source: Ambulatory Visit | Attending: Obstetrics and Gynecology

## 2022-05-26 ENCOUNTER — Encounter (HOSPITAL_BASED_OUTPATIENT_CLINIC_OR_DEPARTMENT_OTHER): Payer: Self-pay | Admitting: Obstetrics and Gynecology

## 2022-05-26 ENCOUNTER — Ambulatory Visit (HOSPITAL_BASED_OUTPATIENT_CLINIC_OR_DEPARTMENT_OTHER)
Admission: RE | Admit: 2022-05-26 | Discharge: 2022-05-26 | Disposition: A | Discharge status: Home / Self Care | Source: Ambulatory Visit | Attending: Obstetrics and Gynecology | Admitting: Obstetrics and Gynecology

## 2022-05-26 DIAGNOSIS — Z87891 Personal history of nicotine dependence: Secondary | ICD-10-CM | POA: Insufficient documentation

## 2022-05-26 DIAGNOSIS — Z3009 Encounter for other general counseling and advice on contraception: Secondary | ICD-10-CM | POA: Diagnosis present

## 2022-05-26 DIAGNOSIS — N838 Other noninflammatory disorders of ovary, fallopian tube and broad ligament: Secondary | ICD-10-CM | POA: Insufficient documentation

## 2022-05-26 DIAGNOSIS — Z302 Encounter for sterilization: Secondary | ICD-10-CM | POA: Insufficient documentation

## 2022-05-26 DIAGNOSIS — Z01818 Encounter for other preprocedural examination: Secondary | ICD-10-CM

## 2022-05-26 HISTORY — DX: Personal history of other diseases of the female genital tract: Z87.42

## 2022-05-26 HISTORY — DX: Personal history of urinary calculi: Z87.442

## 2022-05-26 HISTORY — DX: Presence of spectacles and contact lenses: Z97.3

## 2022-05-26 HISTORY — PX: LAPAROSCOPIC BILATERAL SALPINGECTOMY: SHX5889

## 2022-05-26 HISTORY — DX: Bradycardia, unspecified: R00.1

## 2022-05-26 HISTORY — DX: Nontoxic single thyroid nodule: E04.1

## 2022-05-26 HISTORY — DX: Intervertebral disc disorders with radiculopathy, lumbar region: M51.16

## 2022-05-26 HISTORY — DX: Presence of dental prosthetic device (complete) (partial): Z97.2

## 2022-05-26 LAB — CBC
HCT: 34.5 % — ABNORMAL LOW (ref 36.0–46.0)
Hemoglobin: 10.7 g/dL — ABNORMAL LOW (ref 12.0–15.0)
MCH: 27.1 pg (ref 26.0–34.0)
MCHC: 31 g/dL (ref 30.0–36.0)
MCV: 87.3 fL (ref 80.0–100.0)
Platelets: 309 10*3/uL (ref 150–400)
RBC: 3.95 MIL/uL (ref 3.87–5.11)
RDW: 13.2 % (ref 11.5–15.5)
WBC: 11.1 10*3/uL — ABNORMAL HIGH (ref 4.0–10.5)
nRBC: 0 % (ref 0.0–0.2)

## 2022-05-26 LAB — TYPE AND SCREEN
ABO/RH(D): AB POS
Antibody Screen: NEGATIVE

## 2022-05-26 LAB — ABO/RH: ABO/RH(D): AB POS

## 2022-05-26 LAB — POCT PREGNANCY, URINE: Preg Test, Ur: NEGATIVE

## 2022-05-26 SURGERY — SALPINGECTOMY, BILATERAL, LAPAROSCOPIC
Anesthesia: General | Site: Abdomen | Laterality: Bilateral

## 2022-05-26 MED ORDER — FENTANYL CITRATE (PF) 100 MCG/2ML IJ SOLN
INTRAMUSCULAR | Status: DC | PRN
  Administered 2022-05-26 (×2): 25 ug via INTRAVENOUS
  Administered 2022-05-26: 50 ug via INTRAVENOUS

## 2022-05-26 MED ORDER — SUGAMMADEX SODIUM 200 MG/2ML IV SOLN
INTRAVENOUS | Status: DC | PRN
  Administered 2022-05-26: 110 mg via INTRAVENOUS

## 2022-05-26 MED ORDER — ONDANSETRON HCL 4 MG/2ML IJ SOLN: 4.0000 mg | Freq: Once | INTRAMUSCULAR | Status: DC | PRN

## 2022-05-26 MED ORDER — PROPOFOL 10 MG/ML IV BOLUS
INTRAVENOUS | Status: DC | PRN
  Administered 2022-05-26: 120 mg via INTRAVENOUS

## 2022-05-26 MED ORDER — LIDOCAINE HCL (CARDIAC) PF 100 MG/5ML IV SOSY
PREFILLED_SYRINGE | INTRAVENOUS | Status: DC | PRN
  Administered 2022-05-26: 40 mg via INTRAVENOUS

## 2022-05-26 MED ORDER — LACTATED RINGERS IV SOLN: INTRAVENOUS | Status: DC

## 2022-05-26 MED ORDER — DEXAMETHASONE SODIUM PHOSPHATE 4 MG/ML IJ SOLN
INTRAMUSCULAR | Status: DC | PRN
  Administered 2022-05-26: 8 mg via INTRAVENOUS

## 2022-05-26 MED ORDER — OXYCODONE HCL 5 MG PO TABS: 5.0000 mg | ORAL_TABLET | Freq: Once | ORAL | Status: DC | PRN

## 2022-05-26 MED ORDER — POVIDONE-IODINE 10 % EX SWAB: 2.0000 | Freq: Once | CUTANEOUS | Status: DC

## 2022-05-26 MED ORDER — ACETAMINOPHEN 500 MG PO TABS: 500.0000 mg | ORAL_TABLET | Freq: Four times a day (QID) | ORAL | 0 refills | Status: DC | PRN

## 2022-05-26 MED ORDER — OXYCODONE HCL 5 MG PO TABS: 5.0000 mg | ORAL_TABLET | ORAL | 0 refills | Status: DC | PRN

## 2022-05-26 MED ORDER — OXYCODONE HCL 5 MG/5ML PO SOLN: 5.0000 mg | Freq: Once | ORAL | Status: DC | PRN

## 2022-05-26 MED ORDER — BUPIVACAINE HCL (PF) 0.25 % IJ SOLN
INTRAMUSCULAR | Status: DC | PRN
  Administered 2022-05-26: 10 mL

## 2022-05-26 MED ORDER — AMISULPRIDE (ANTIEMETIC) 5 MG/2ML IV SOLN: 10.0000 mg | Freq: Once | INTRAVENOUS | Status: DC | PRN

## 2022-05-26 MED ORDER — LIDOCAINE HCL (PF) 2 % IJ SOLN
INTRAMUSCULAR | Status: AC
  Filled 2022-05-26: qty 5

## 2022-05-26 MED ORDER — IBUPROFEN 800 MG PO TABS: 800.0000 mg | ORAL_TABLET | Freq: Three times a day (TID) | ORAL | 0 refills | Status: DC | PRN

## 2022-05-26 MED ORDER — KETOROLAC TROMETHAMINE 30 MG/ML IJ SOLN
INTRAMUSCULAR | Status: AC
  Filled 2022-05-26: qty 2

## 2022-05-26 MED ORDER — MIDAZOLAM HCL 2 MG/2ML IJ SOLN
INTRAMUSCULAR | Status: AC
  Filled 2022-05-26: qty 2

## 2022-05-26 MED ORDER — ACETAMINOPHEN 500 MG PO TABS: 1000.0000 mg | ORAL_TABLET | Freq: Once | ORAL | Status: DC

## 2022-05-26 MED ORDER — 0.9 % SODIUM CHLORIDE (POUR BTL) OPTIME
TOPICAL | Status: DC | PRN
  Administered 2022-05-26: 500 mL

## 2022-05-26 MED ORDER — KETOROLAC TROMETHAMINE 15 MG/ML IJ SOLN: 15.0000 mg | INTRAMUSCULAR | Status: DC

## 2022-05-26 MED ORDER — KETOROLAC TROMETHAMINE 30 MG/ML IJ SOLN
INTRAMUSCULAR | Status: DC | PRN
  Administered 2022-05-26: 30 mg via INTRAVENOUS

## 2022-05-26 MED ORDER — ACETAMINOPHEN 500 MG PO TABS
ORAL_TABLET | ORAL | Status: AC
  Filled 2022-05-26: qty 2

## 2022-05-26 MED ORDER — ONDANSETRON HCL 4 MG/2ML IJ SOLN
INTRAMUSCULAR | Status: DC | PRN
  Administered 2022-05-26: 4 mg via INTRAVENOUS

## 2022-05-26 MED ORDER — ROCURONIUM BROMIDE 100 MG/10ML IV SOLN
INTRAVENOUS | Status: DC | PRN
  Administered 2022-05-26: 30 mg via INTRAVENOUS

## 2022-05-26 MED ORDER — ACETAMINOPHEN 500 MG PO TABS
1000.0000 mg | ORAL_TABLET | ORAL | Status: AC
  Administered 2022-05-26: 1000 mg via ORAL

## 2022-05-26 MED ORDER — ONDANSETRON HCL 4 MG/2ML IJ SOLN
INTRAMUSCULAR | Status: AC
  Filled 2022-05-26: qty 2

## 2022-05-26 MED ORDER — MIDAZOLAM HCL 2 MG/2ML IJ SOLN
INTRAMUSCULAR | Status: DC | PRN
  Administered 2022-05-26: 2 mg via INTRAVENOUS

## 2022-05-26 MED ORDER — FENTANYL CITRATE (PF) 100 MCG/2ML IJ SOLN: 25.0000 ug | INTRAMUSCULAR | Status: DC | PRN

## 2022-05-26 SURGICAL SUPPLY — 27 items
ADH SKN CLS APL DERMABOND .7 (GAUZE/BANDAGES/DRESSINGS) ×1
APL PRP STRL LF DISP 70% ISPRP (MISCELLANEOUS) ×1
CHLORAPREP W/TINT 26 (MISCELLANEOUS) IMPLANT
COVER MAYO STAND STRL (DRAPES) ×1 IMPLANT
DERMABOND ADVANCED .7 DNX12 (GAUZE/BANDAGES/DRESSINGS) ×1 IMPLANT
GLOVE BIO SURGEON STRL SZ 6 (GLOVE) ×1 IMPLANT
GLOVE BIOGEL PI IND STRL 7.0 (GLOVE) IMPLANT
GOWN STRL REUS W/TWL LRG LVL3 (GOWN DISPOSABLE) ×1 IMPLANT
IRRIG SUCT STRYKERFLOW 2 WTIP (MISCELLANEOUS)
IRRIGATION SUCT STRKRFLW 2 WTP (MISCELLANEOUS) IMPLANT
KIT TURNOVER CYSTO (KITS) ×1 IMPLANT
NDL INSUFFLATION 14GA 120MM (NEEDLE) ×1 IMPLANT
NEEDLE INSUFFLATION 14GA 120MM (NEEDLE) ×1 IMPLANT
NS IRRIG 500ML POUR BTL (IV SOLUTION) IMPLANT
PACK LAPAROSCOPY BASIN (CUSTOM PROCEDURE TRAY) ×1 IMPLANT
PACK TRENDGUARD 450 HYBRID PRO (MISCELLANEOUS) ×1 IMPLANT
SEALER TISSUE G2 CVD JAW 35 (ENDOMECHANICALS) ×1 IMPLANT
SEALER TISSUE G2 CVD JAW 45CM (ENDOMECHANICALS) ×1
SET TUBE SMOKE EVAC HIGH FLOW (TUBING) ×1 IMPLANT
SLEEVE Z-THREAD 5X100MM (TROCAR) ×1 IMPLANT
SUT MNCRL AB 4-0 PS2 18 (SUTURE) IMPLANT
TOWEL OR 17X26 10 PK STRL BLUE (TOWEL DISPOSABLE) ×1 IMPLANT
TRAY FOLEY W/BAG SLVR 14FR LF (SET/KITS/TRAYS/PACK) IMPLANT
TRENDGUARD 450 HYBRID PRO PACK (MISCELLANEOUS) ×1
TROCAR KII 8X100ML NONTHREADED (TROCAR) IMPLANT
TROCAR Z-THREAD FIOS 5X100MM (TROCAR) ×1 IMPLANT
WARMER LAPAROSCOPE (MISCELLANEOUS) ×1 IMPLANT

## 2022-05-26 NOTE — Anesthesia Postprocedure Evaluation (Signed)
Anesthesia Post Note  Patient: Christina Stone  Procedure(s) Performed: LAPAROSCOPIC BILATERAL SALPINGECTOMY (Bilateral: Abdomen)     Patient location during evaluation: PACU Anesthesia Type: General Level of consciousness: awake and alert Pain management: pain level controlled Vital Signs Assessment: post-procedure vital signs reviewed and stable Respiratory status: spontaneous breathing, nonlabored ventilation and respiratory function stable Cardiovascular status: blood pressure returned to baseline and stable Postop Assessment: no apparent nausea or vomiting Anesthetic complications: no   No notable events documented.  Last Vitals:  Vitals:   05/26/22 1530 05/26/22 1542  BP: 112/77 114/76  Pulse: 66 (!) 55  Resp: 13 15  Temp:    SpO2: 100% 100%    Last Pain:  Vitals:   05/26/22 1542  TempSrc:   PainSc: 3                  Lucretia Kern

## 2022-05-26 NOTE — Op Note (Signed)
Christina Stone PROCEDURE DATE: 05/26/2022   PREOPERATIVE DIAGNOSIS:  Undesired fertility  POSTOPERATIVE DIAGNOSIS:  Undesired fertility  PROCEDURE:  Laparoscopic Bilateral Salpingectomy   SURGEON:  Dr. Milas Hock  ASSISTANT:  None  ANESTHESIA:  General endotracheal, 0.25% marcaine (10 cc)  COMPLICATIONS:  None immediate.  ESTIMATED BLOOD LOSS:  5 ml.  FLUIDS: 700 ml LR.  URINE OUTPUT:  100 ml of clear urine.  INDICATIONS: 41 y.o. [id number] with undesired fertility, desires permanent sterilization.  Other forms of contraception were discussed with patient and emphasized alternatives of vasectomy, IUDs and Nexplanon as they have equivalent contraceptive efficacy; she declines all other modalities.  Risks of procedure discussed with patient including permanence of method, risk of regret, bleeding, infection, injury to surrounding organs and need for additional procedures including laparotomy.  Failure risk less than 0.5% with increased risk of ectopic gestation if pregnancy occurs was also discussed with patient.  Written informed consent was obtained.    FINDINGS:  Normal uterus, fallopian tubes, and ovaries.  TECHNIQUE:   After all consents were signed and questions were answered the patient was taken to the operating room where anesthesia was found to be adequate. A catheter was used to drain her bladder. She was prepped and draped in the usual sterile fashion in the dorsal supine position. A timeout was performed.   A skin incision was made with the 11 blade scalpel in the umbilicus. She had a natural umbilical defect and I was able to see directly into her cavity. The trocar was placed under direct visualization.  Pneumoperitoneum achieved to a pressure of 15.  Below the point of entry and the pelvis was inspected with no evidence of injury.   The scalpel was then used to make two 61mm incsions, one in the LLQ and one in the suprapubic area. 98mm  and 11mm optiview ports were inserted  in the LLQ and suprapubic area respectively. The ureters were identified bilaterally. The fallopian tubes were cauterized, cut, and detached from their surrounding pelvic structures with the enseal. No bleeding was noted. The specimens were removed through the 32mm port. The pelvis was inspected and was hemostatic. All ports were then withdrawn and the gas drained from abdomen. They were then closed in a subcuticular fashion with 4-0 monocryl and dressings were placed.  The patient will be discharged to home as per PACU criteria.  Routine postoperative instructions given.  She will follow up in the office in 4 wks for postoperative evaluation.

## 2022-05-26 NOTE — Anesthesia Preprocedure Evaluation (Signed)
Anesthesia Evaluation  Patient identified by MRN, date of birth, ID band Patient awake    Reviewed: Allergy & Precautions, NPO status , Patient's Chart, lab work & pertinent test results  History of Anesthesia Complications Negative for: history of anesthetic complications  Airway Mallampati: II  TM Distance: >3 FB Neck ROM: Full    Dental  (+) Dental Advisory Given   Pulmonary neg pulmonary ROS, former smoker   Pulmonary exam normal        Cardiovascular negative cardio ROS Normal cardiovascular exam     Neuro/Psych negative neurological ROS     GI/Hepatic negative GI ROS, Neg liver ROS,,,  Endo/Other  negative endocrine ROS    Renal/GU negative Renal ROS  negative genitourinary   Musculoskeletal negative musculoskeletal ROS (+)    Abdominal   Peds  Hematology  (+) Blood dyscrasia, anemia   Anesthesia Other Findings   Reproductive/Obstetrics                              Anesthesia Physical Anesthesia Plan  ASA: 2  Anesthesia Plan: General   Post-op Pain Management: Tylenol PO (pre-op)* and Toradol IV (intra-op)*   Induction: Intravenous  PONV Risk Score and Plan: 4 or greater and Ondansetron, Dexamethasone, Treatment may vary due to age or medical condition and Midazolam  Airway Management Planned: Oral ETT  Additional Equipment: None  Intra-op Plan:   Post-operative Plan: Extubation in OR  Informed Consent: I have reviewed the patients History and Physical, chart, labs and discussed the procedure including the risks, benefits and alternatives for the proposed anesthesia with the patient or authorized representative who has indicated his/her understanding and acceptance.     Dental advisory given  Plan Discussed with:   Anesthesia Plan Comments:          Anesthesia Quick Evaluation

## 2022-05-26 NOTE — Discharge Instructions (Signed)

## 2022-05-26 NOTE — Transfer of Care (Signed)
Immediate Anesthesia Transfer of Care Note  Patient: Christina Stone  Procedure(s) Performed: LAPAROSCOPIC BILATERAL SALPINGECTOMY (Bilateral: Abdomen)  Patient Location: PACU  Anesthesia Type:General  Level of Consciousness: awake, alert , oriented, and patient cooperative  Airway & Oxygen Therapy: Patient Spontanous Breathing and Patient connected to nasal cannula oxygen  Post-op Assessment: Report given to RN and Post -op Vital signs reviewed and stable  Post vital signs: Reviewed and stable  Last Vitals:  Vitals Value Taken Time  BP 119/72 05/26/22 1512  Temp    Pulse 76 05/26/22 1515  Resp 16 05/26/22 1515  SpO2 100 % 05/26/22 1515  Vitals shown include unvalidated device data.  Last Pain:  Vitals:   05/26/22 1255  TempSrc: Oral  PainSc: 0-No pain      Patients Stated Pain Goal: 5 (05/26/22 1255)  Complications: No notable events documented.

## 2022-05-26 NOTE — Anesthesia Procedure Notes (Signed)
Procedure Name: Intubation Date/Time: 05/26/2022 2:34 PM  Performed by: Georgeanne Nim, CRNAPre-anesthesia Checklist: Patient identified, Emergency Drugs available, Suction available, Patient being monitored and Timeout performed Patient Re-evaluated:Patient Re-evaluated prior to induction Oxygen Delivery Method: Circle system utilized Preoxygenation: Pre-oxygenation with 100% oxygen Induction Type: IV induction Ventilation: Mask ventilation without difficulty Laryngoscope Size: Mac and 3 Grade View: Grade I Tube type: Oral Tube size: 7.0 mm Number of attempts: 1 Airway Equipment and Method: Stylet Placement Confirmation: ETT inserted through vocal cords under direct vision, positive ETCO2, CO2 detector and breath sounds checked- equal and bilateral Secured at: 19 cm Tube secured with: Tape Dental Injury: Teeth and Oropharynx as per pre-operative assessment

## 2022-05-26 NOTE — H&P (Signed)
Faculty Practice Obstetrics and Gynecology Attending History and Physical  Christina Stone is a 41 y.o. [id number] who presents today for L/S salpingectomy. She is sure she is done with children. She has used NFP until this time. She was hoping spouse would have a vasectomy but she does not want to wait or risk pregnancy any longer. She understands this is permanent and irreversible.   Past Medical History:  Diagnosis Date   Bradycardia    05-24-2022 per pt her normal heart rate 45 to 50s   History of abnormal cervical Pap smear    History of kidney stones    Lumbar disc herniation with radiculopathy    Thyroid nodule    endocrinologist--- dr m. levy;   hx bx left nodule 03/ 2022 benign   Wears glasses    Wears partial dentures    upper   Past Surgical History:  Procedure Laterality Date   CYSTOSCOPY WITH URETEROSCOPY, STONE BASKETRY AND STENT PLACEMENT  2009   WISDOM TOOTH EXTRACTION     OB History  Gravida Para Term Preterm AB Living  2 2 2     2   SAB IAB Ectopic Multiple Live Births               # Outcome Date GA Lbr Len/2nd Weight Sex Delivery Anes PTL Lv  2 Term      Vag-Spont     1 Term      Vag-Spont     Patient denies any other pertinent gynecologic issues.  No current facility-administered medications on file prior to encounter.   Current Outpatient Medications on File Prior to Encounter  Medication Sig Dispense Refill   Ascorbic Acid (VITA-C PO) Take by mouth daily.     Cholecalciferol (VITAMIN D-3 PO) Take by mouth daily.     estradiol-levonorgestrel (CLIMARAPRO) 0.045-0.015 MG/DAY Place 1 patch onto the skin once a week. 4 patch 12   gabapentin (NEURONTIN) 600 MG tablet Take 600 mg by mouth 2 (two) times daily.   3   MAGNESIUM PO Take by mouth daily.     methocarbamol (ROBAXIN) 750 MG tablet Take 750 mg by mouth 3 (three) times daily.     Potassium (POTASSIMIN PO) Take by mouth daily. OTC     Allergies  Allergen Reactions   Biaxin [Clarithromycin] Hives    Penicillins Anaphylaxis    Has patient had a PCN reaction causing immediate rash, facial/tongue/throat swelling, SOB or lightheadedness with hypotension: Has patient had a PCN reaction causing severe rash involving mucus membranes or skin necrosis:  Has patient had a PCN reaction that required hospitalization Did PCN reaction occurring within the last 10 years: childhood allergy If all of the above answers are "NO", then may proceed with Cephalosporin use. per pt "i dont remember i was really young, but it puts me into shock   Tramadol Other (See Comments)    Causes dizziness    Social History:   reports that she quit smoking about 2 years ago. Her smoking use included cigarettes. She has a 13.50 pack-year smoking history. She has never used smokeless tobacco. She reports that she does not currently use alcohol. She reports that she does not use drugs. Family History  Problem Relation Age of Onset   Breast cancer Maternal Grandmother        great grand   Breast cancer Maternal Aunt     Review of Systems: Pertinent items noted in HPI and remainder of comprehensive ROS otherwise negative.  PHYSICAL EXAM: Height  5\' 4"  (1.626 m), weight 52.2 kg, last menstrual period 05/22/2022. CONSTITUTIONAL: Well-developed, well-nourished female in no acute distress.  HENT:  Normocephalic, atraumatic, External right and left ear normal. Oropharynx is clear and moist EYES: Conjunctivae and EOM are normal. Pupils are equal, round, and reactive to light. No scleral icterus.  NECK: Normal range of motion, supple, no masses SKIN: Skin is warm and dry. No rash noted. Not diaphoretic. No erythema. No pallor. NEUROLOGIC: Alert and oriented to person, place, and time. Normal reflexes, muscle tone coordination. No cranial nerve deficit noted. PSYCHIATRIC: Normal mood and affect. Normal behavior. Normal judgment and thought content. CARDIOVASCULAR: Normal heart rate noted, regular rhythm RESPIRATORY: Effort and  breath sounds normal, no problems with respiration noted ABDOMEN: Soft, nontender, nondistended. PELVIC: Not examined  MUSCULOSKELETAL: Normal range of motion. No tenderness.  No cyanosis, clubbing, or edema.  2+ distal pulses.  Labs: No results found for this or any previous visit (from the past 336 hour(s)).  Imaging Studies: No results found.  Assessment: Active Problems:   Unwanted fertility   Plan: - She desires permanent sterilization. Discussed alternatives including LARC options and vasectomy. She declines these options.  - Risks of surgery include but are not limited to: bleeding, infection, injury to surrounding organs/tissues (i.e. bowel/bladder/ureters), need for additional procedures, wound complications, hospital re-admission, and conversion to open surgery, VTE - Reviewed restrictions and recovery following surgery - After all questions answer, consent reviewed and signed.    13/05/2022, MD, FACOG Obstetrician & Gynecologist, [facility] for [facility], [facility]

## 2022-05-28 ENCOUNTER — Encounter: Payer: Self-pay | Admitting: Obstetrics and Gynecology

## 2022-05-28 ENCOUNTER — Other Ambulatory Visit: Payer: Self-pay

## 2022-05-28 ENCOUNTER — Encounter (HOSPITAL_BASED_OUTPATIENT_CLINIC_OR_DEPARTMENT_OTHER): Payer: Self-pay | Admitting: Obstetrics and Gynecology

## 2022-05-28 DIAGNOSIS — N951 Menopausal and female climacteric states: Secondary | ICD-10-CM

## 2022-05-28 LAB — SURGICAL PATHOLOGY

## 2022-05-28 MED ORDER — ESTRADIOL-LEVONORGESTREL 0.045-0.015 MG/DAY TD PTWK: 1.0000 | MEDICATED_PATCH | TRANSDERMAL | 3 refills | Status: DC

## 2022-05-28 NOTE — Progress Notes (Signed)
Climara Rx for [facility] canceled.  New Climara Rx 90 day supply sent to [facility] per pt request.

## 2022-06-12 NOTE — Progress Notes (Unsigned)
   GYNECOLOGY OFFICE VISIT NOTE  History:   Christina Stone is a 41 y.o. [id number] here today for postop check s/p L/S salpingectomy. Her surgery was without complications.   She has no complaints.     Past Medical History:  Diagnosis Date   Bradycardia    05-24-2022 per pt her normal heart rate 45 to 50s   History of abnormal cervical Pap smear    History of kidney stones    Lumbar disc herniation with radiculopathy    Thyroid nodule    endocrinologist--- dr m. levy;   hx bx left nodule 03/ 2022 benign   Wears glasses    Wears partial dentures    upper    Past Surgical History:  Procedure Laterality Date   CYSTOSCOPY WITH URETEROSCOPY, STONE BASKETRY AND STENT PLACEMENT  2009   LAPAROSCOPIC BILATERAL SALPINGECTOMY Bilateral 05/26/2022   Procedure: LAPAROSCOPIC BILATERAL SALPINGECTOMY;  Surgeon: Milas Hock, MD;  Location: [facility];  Service: Gynecology;  Laterality: Bilateral;   WISDOM TOOTH EXTRACTION      The following portions of the patient's history were reviewed and updated as appropriate: allergies, current medications, past family history, past medical history, past social history, past surgical history and problem list.   Review of Systems:  Pertinent items noted in HPI and remainder of comprehensive ROS otherwise negative.  Physical Exam:  BP 132/84   Pulse 67   Ht 5\' 4"  (1.626 m)   Wt 120 lb (54.4 kg)   LMP 05/22/2022 (Exact Date)   BMI 20.60 kg/m  CONSTITUTIONAL: Well-developed, well-nourished female in no acute distress.  HEENT:  Normocephalic, atraumatic. External right and left ear normal. No scleral icterus.  NECK: Normal range of motion, supple, no masses noted on observation SKIN: No rash noted. Not diaphoretic. No erythema. No pallor. MUSCULOSKELETAL: Normal range of motion. No edema noted. NEUROLOGIC: Alert and oriented to person, place, and time. Normal muscle tone coordination. No cranial nerve deficit noted. PSYCHIATRIC: Normal  mood and affect. Normal behavior. Normal judgment and thought content.  CARDIOVASCULAR: Normal heart rate noted RESPIRATORY: Effort and breath sounds normal, no problems with respiration noted ABDOMEN: No masses noted. No other overt distention noted.  L/S incisions c/d/I   PELVIC: Deferred  Labs and Imaging No results found for this or any previous visit (from the past 168 hour(s)). No results found.  Assessment and Plan:   1. Postop check Doing well, no restrictions.    Routine preventative health maintenance measures emphasized. Please refer to After Visit Summary for other counseling recommendations.   No follow-ups on file.  13/05/2022, MD, FACOG Obstetrician & Gynecologist, [facility] for [facility], [facility]

## 2022-06-16 ENCOUNTER — Encounter: Payer: Self-pay | Admitting: Obstetrics and Gynecology

## 2022-06-16 ENCOUNTER — Ambulatory Visit (INDEPENDENT_AMBULATORY_CARE_PROVIDER_SITE_OTHER): Admitting: Obstetrics and Gynecology

## 2022-06-16 VITALS — BP 132/84 | HR 67 | Ht 64.0 in | Wt 120.0 lb

## 2022-06-16 DIAGNOSIS — Z09 Encounter for follow-up examination after completed treatment for conditions other than malignant neoplasm: Secondary | ICD-10-CM

## 2022-06-16 NOTE — Progress Notes (Signed)
Pt has no complaints since surgery 

## 2022-12-13 LAB — HM MAMMOGRAPHY

## 2023-04-04 ENCOUNTER — Encounter: Payer: Self-pay | Admitting: Obstetrics and Gynecology

## 2023-04-04 ENCOUNTER — Ambulatory Visit: Admitting: Obstetrics and Gynecology

## 2023-04-04 VITALS — BP 111/68 | HR 76 | Resp 16 | Ht 64.0 in | Wt 126.0 lb

## 2023-04-04 DIAGNOSIS — Z803 Family history of malignant neoplasm of breast: Secondary | ICD-10-CM | POA: Diagnosis not present

## 2023-04-04 DIAGNOSIS — R232 Flushing: Secondary | ICD-10-CM | POA: Diagnosis not present

## 2023-04-04 DIAGNOSIS — Z01419 Encounter for gynecological examination (general) (routine) without abnormal findings: Secondary | ICD-10-CM | POA: Diagnosis not present

## 2023-04-04 DIAGNOSIS — R87619 Unspecified abnormal cytological findings in specimens from cervix uteri: Secondary | ICD-10-CM | POA: Diagnosis not present

## 2023-04-04 DIAGNOSIS — N951 Menopausal and female climacteric states: Secondary | ICD-10-CM

## 2023-04-04 MED ORDER — PROGESTERONE 200 MG PO CAPS: 200.0000 mg | ORAL_CAPSULE | Freq: Every day | ORAL | 3 refills | Status: DC

## 2023-04-04 MED ORDER — ESTRADIOL 0.0375 MG/24HR TD PTWK: 0.0375 mg | MEDICATED_PATCH | TRANSDERMAL | 12 refills | Status: DC

## 2023-04-04 NOTE — Progress Notes (Signed)
ANNUAL EXAM Patient name: Christina Stone MRN [id number]  Date of birth: 07/17/1980 Chief Complaint:   Annual Exam  History of Present Illness:   Christina Stone is a 42 y.o. G8P2002 female being seen today for a routine annual exam.   Current concerns:  Discussed the use of AI scribe software for clinical note transcription with the patient, who gave verbal consent to proceed.    The patient, with a history of perimenopause, presents with irregular menstrual bleeding for the past seven months. The bleeding alternates between heavy and light every other week. The patient has been using a hormonal patch, which has helped with night sweats but has not regulated the menstrual bleeding. The patient reports that the night sweats have significantly reduced to about once a month, from being previously very severe. The patient is compliant with the use of the patch, changing it every Tuesday as instructed. The patient is open to trying different treatment options to manage her symptoms.     Current birth control: Salpingectomy  Patient's last menstrual period was 03/17/2023.   Last MXR: 12/13/22 birad 1 mammo Last Pap/Pap History:  03/2021 - LSIL, HPV neg 2023: wnl, HPV neg  Review of Systems:   Pertinent items are noted in HPI Denies any headaches, blurred vision, fatigue, shortness of breath, chest pain, abdominal pain, abnormal vaginal discharge/itching/odor/irritation,  bowel movements, urination, or intercourse unless otherwise stated above.  Pertinent History Reviewed:  Reviewed past medical,surgical, social and family history.  Reviewed problem list, medications and allergies. Physical Assessment:   Vitals:   04/04/23 1514  BP: 111/68  Pulse: 76  Resp: 16  Weight: 126 lb (57.2 kg)  Height: 5\' 4"  (1.626 m)  Body mass index is 21.63 kg/m.   Physical Examination:  General appearance - well appearing, and in no distress Mental status - alert, oriented to person, place, and  time Psych:  She has a normal mood and affect Skin - warm and dry, normal color, no suspicious lesions noted Chest - effort normal Heart - normal rate  Breasts - breasts appear normal, no suspicious masses, no skin or nipple changes or axillary nodes Abdomen - soft, nontender, nondistended, no masses or organomegaly Pelvic -  VULVA: normal appearing vulva with no masses, tenderness or lesions  VAGINA: normal appearing vagina with normal color and discharge, no lesions  CERVIX: normal appearing cervix without discharge or lesions, no CMT UTERUS: uterus is felt to be normal size, shape, consistency and nontender  ADNEXA: No adnexal masses or tenderness noted. Extremities:  No swelling or varicosities noted  Chaperone present for exam  No results found for this or any previous visit (from the past 24 hour(s)).  Assessment & Plan:  Christina Stone was seen today for annual exam.  Diagnoses and all orders for this visit:  Abnormal cervical Papanicolaou smear, unspecified abnormal pap finding Repap last year was normal and HPV negative. Resume normal screening with 3 yr follow up (next pap would be due 2026).   Family history of breast cancer in female BRCA1/2 neg   Encounter for annual routine gynecological examination - Cervical cancer screening: Discussed guidelines. Pap with HPV done - STD Testing: declines - Birth Control: Desires tubal - see below - Breast Health: Encouraged self breast awareness/SBE. Teaching provided. Discussed limits of clinical breast exam for detecting breast cancer. Rx given for MXR for next year.  - F/U 12 months and prn  Hot flashes Irregular and frequent menses despite use of Climara Pro patch. Night sweats  have improved significantly with current treatment. -Change to lower dose Climara patch (0.0375) to potentially reduce bleeding frequency. -Add cyclic Prometrium to protect uterine lining from overstimulation by estrogen. Start with monthly cycle and  consider spacing out if well-tolerated and effective. -Check in via MyChart or in-person to adjust treatment as needed. -     estradiol (CLIMARA) 0.0375 mg/24hr patch; Place 1 patch (0.0375 mg total) onto the skin once a week. -     progesterone (PROMETRIUM) 200 MG capsule; Take 1 capsule (200 mg total) by mouth daily. Take orally ten days per month    Orders Placed This Encounter  Procedures   HM MAMMOGRAPHY    Meds:  Meds ordered this encounter  Medications   estradiol (CLIMARA) 0.0375 mg/24hr patch    Sig: Place 1 patch (0.0375 mg total) onto the skin once a week.    Dispense:  4 patch    Refill:  12   progesterone (PROMETRIUM) 200 MG capsule    Sig: Take 1 capsule (200 mg total) by mouth daily. Take orally ten days per month    Dispense:  30 capsule    Refill:  3    Follow-up: No follow-ups on file.  Milas Hock, MD 04/04/2023 3:54 PM

## 2023-05-06 ENCOUNTER — Other Ambulatory Visit (HOSPITAL_BASED_OUTPATIENT_CLINIC_OR_DEPARTMENT_OTHER): Payer: Self-pay

## 2023-05-06 MED ORDER — INFLUENZA VIRUS VACC SPLIT PF (FLUZONE) 0.5 ML IM SUSY
0.5000 mL | PREFILLED_SYRINGE | Freq: Once | INTRAMUSCULAR | 0 refills | Status: AC
  Filled 2023-05-06: qty 0.5, 1d supply, fill #0

## 2024-01-08 ENCOUNTER — Encounter (HOSPITAL_BASED_OUTPATIENT_CLINIC_OR_DEPARTMENT_OTHER): Payer: Self-pay

## 2024-01-08 ENCOUNTER — Other Ambulatory Visit: Payer: Self-pay

## 2024-01-08 ENCOUNTER — Emergency Department (HOSPITAL_BASED_OUTPATIENT_CLINIC_OR_DEPARTMENT_OTHER): Admitting: Radiology

## 2024-01-08 ENCOUNTER — Emergency Department (HOSPITAL_BASED_OUTPATIENT_CLINIC_OR_DEPARTMENT_OTHER)

## 2024-01-08 ENCOUNTER — Emergency Department (HOSPITAL_BASED_OUTPATIENT_CLINIC_OR_DEPARTMENT_OTHER)
Admission: EM | Admit: 2024-01-08 | Discharge: 2024-01-08 | Disposition: A | Discharge status: Home / Self Care | Attending: Emergency Medicine | Admitting: Emergency Medicine

## 2024-01-08 DIAGNOSIS — J0100 Acute maxillary sinusitis, unspecified: Secondary | ICD-10-CM | POA: Insufficient documentation

## 2024-01-08 DIAGNOSIS — K08109 Complete loss of teeth, unspecified cause, unspecified class: Secondary | ICD-10-CM | POA: Diagnosis not present

## 2024-01-08 DIAGNOSIS — R519 Headache, unspecified: Secondary | ICD-10-CM | POA: Diagnosis present

## 2024-01-08 LAB — CBC
HCT: 37.8 % (ref 36.0–46.0)
Hemoglobin: 12.2 g/dL (ref 12.0–15.0)
MCH: 27.6 pg (ref 26.0–34.0)
MCHC: 32.3 g/dL (ref 30.0–36.0)
MCV: 85.5 fL (ref 80.0–100.0)
Platelets: 423 10*3/uL — ABNORMAL HIGH (ref 150–400)
RBC: 4.42 MIL/uL (ref 3.87–5.11)
RDW: 13.5 % (ref 11.5–15.5)
WBC: 7.8 10*3/uL (ref 4.0–10.5)
nRBC: 0 % (ref 0.0–0.2)

## 2024-01-08 LAB — BASIC METABOLIC PANEL WITH GFR
Anion gap: 10 (ref 5–15)
BUN: 11 mg/dL (ref 6–20)
CO2: 23 mmol/L (ref 22–32)
Calcium: 9.4 mg/dL (ref 8.9–10.3)
Chloride: 104 mmol/L (ref 98–111)
Creatinine, Ser: 0.86 mg/dL (ref 0.44–1.00)
GFR, Estimated: >60 mL/min (ref >60)
Glucose, Bld: 81 mg/dL (ref 70–99)
Potassium: 4.2 mmol/L (ref 3.5–5.1)
Sodium: 138 mmol/L (ref 135–145)

## 2024-01-08 LAB — TROPONIN T, HIGH SENSITIVITY
Troponin T High Sensitivity: <15 ng/L (ref <19)
Troponin T High Sensitivity: <15 ng/L (ref <19)

## 2024-01-08 LAB — PREGNANCY, URINE: Preg Test, Ur: NEGATIVE

## 2024-01-08 MED ORDER — DEXAMETHASONE SODIUM PHOSPHATE 10 MG/ML IJ SOLN
8.0000 mg | Freq: Once | INTRAMUSCULAR | Status: AC
  Administered 2024-01-08: 8 mg via INTRAVENOUS
  Filled 2024-01-08: qty 1

## 2024-01-08 MED ORDER — DIPHENHYDRAMINE HCL 50 MG/ML IJ SOLN
25.0000 mg | Freq: Once | INTRAMUSCULAR | Status: AC
  Administered 2024-01-08: 25 mg via INTRAVENOUS
  Filled 2024-01-08: qty 1

## 2024-01-08 MED ORDER — ACETAMINOPHEN 325 MG PO TABS
650.0000 mg | ORAL_TABLET | Freq: Once | ORAL | Status: AC
  Administered 2024-01-08: 650 mg via ORAL
  Filled 2024-01-08: qty 2

## 2024-01-08 MED ORDER — METOCLOPRAMIDE HCL 5 MG/ML IJ SOLN
10.0000 mg | Freq: Once | INTRAMUSCULAR | Status: AC
  Administered 2024-01-08: 10 mg via INTRAVENOUS
  Filled 2024-01-08: qty 2

## 2024-01-08 MED ORDER — DEXAMETHASONE SODIUM PHOSPHATE 10 MG/ML IJ SOLN: 8.0000 mg | Freq: Once | INTRAMUSCULAR | Status: DC

## 2024-01-08 MED ORDER — DOXYCYCLINE HYCLATE 100 MG PO CAPS: 100.0000 mg | ORAL_CAPSULE | Freq: Two times a day (BID) | ORAL | 0 refills | Status: AC

## 2024-01-08 MED ORDER — DOXYCYCLINE HYCLATE 100 MG PO TABS
100.0000 mg | ORAL_TABLET | Freq: Once | ORAL | Status: AC
  Administered 2024-01-08: 100 mg via ORAL
  Filled 2024-01-08: qty 1

## 2024-01-08 NOTE — Discharge Instructions (Signed)
 I am glad you are feeling better.  Please follow-up with your primary care provider, dentist, and ENT provider.  Seek emergency care if experiencing any new or worsening symptoms.  Alternating between 650 mg Tylenol  and 400 mg Advil : The best way to alternate taking Acetaminophen  (example Tylenol ) and Ibuprofen  (example Advil /Motrin ) is to take them 3 hours apart. For example, if you take ibuprofen  at 6 am you can then take Tylenol  at 9 am. You can continue this regimen throughout the day, making sure you do not exceed the recommended maximum dose for each drug.

## 2024-01-08 NOTE — ED Triage Notes (Signed)
 Tooth pulled last week.  States had opening into sinus. Has bad taste.  States developed headache on Friday Light sensitive  Headache over top of head.

## 2024-01-08 NOTE — ED Provider Notes (Signed)
 [place] EMERGENCY DEPARTMENT AT [facility] Provider Note   CSN: [id number] Arrival date & time: 01/08/24  0940     Patient presents with: Headache   Christina Stone is a 43 y.o. female with PMHx HLD, MDD, bradycardia who presents to ED concerned for headache. Patient had a tooth pulled 3 weeks ago on 12/15/2023. Dentist stating that there was a hole into the sinus. Patient followed up 2 weeks later and dentist stating that hole is healing. Patient then started having bad taste and drainage in mouth 9 days ago. Patient then started having headache 2 days ago. Patient has taken sudafed, dayquil, and BC powder without much relief. Patient was prescribed azithromycin 3 weeks ago when tooth was first pulled, but has not been on ABX since. Headache is frontal and also associated with left maxillary sinus area. Patient with hot flashes but denies recent fevers at home and thinks that hot flashes is probably d/t menopause.  Patient stating that she started having left sided chest pain during initial interview. Patient thinks it could be d/t anxiety.  Denies fever, dyspnea, cough, nausea, vomiting, diarrhea.     Headache      Prior to Admission medications   Medication Sig Start Date End Date Taking? Authorizing Provider  doxycycline (VIBRAMYCIN) 100 MG capsule Take 1 capsule (100 mg total) by mouth 2 (two) times daily for 7 days. 01/08/24 01/15/24 Yes Teairra Millar, Nidia FALCON, PA-C  Ascorbic Acid (VITA-C PO) Take by mouth daily.    [provider]  Cholecalciferol (VITAMIN D -3 PO) Take by mouth daily.    [provider]  estradiol  (CLIMARA ) 0.0375 mg/24hr patch Place 1 patch (0.0375 mg total) onto the skin once a week. 04/04/23   Cleatus Moccasin, MD  estradiol -levonorgestrel  [facility]) 0.045-0.015 MG/DAY Place 1 patch onto the skin once a week. 05/28/22   Cleatus Moccasin, MD  gabapentin (NEURONTIN) 600 MG tablet Take 600 mg by mouth 2 (two) times daily.  04/25/15   [provider]  Lysine 1000 MG TABS  02/10/23   [provider]  MAGNESIUM PO Take by mouth daily.    [provider]  methocarbamol  (ROBAXIN ) 750 MG tablet Take 750 mg by mouth 3 (three) times daily. 08/07/20   [provider]  Potassium (POTASSIMIN PO) Take by mouth daily. OTC    [provider]  progesterone  (PROMETRIUM ) 200 MG capsule Take 1 capsule (200 mg total) by mouth daily. Take orally ten days per month 04/04/23   Cleatus Moccasin, MD    Allergies: Biaxin [clarithromycin], Penicillins, and Tramadol     Review of Systems  Neurological:  Positive for headaches.    Updated Vital Signs BP 100/66   Pulse 83   Temp 98.1 F (36.7 C)   Resp 15   Ht 5' 4 (1.626 m)   Wt 52.2 kg   LMP 12/31/2023 (Exact Date)   SpO2 97%   BMI 19.74 kg/m   Physical Exam Vitals and nursing note reviewed.  Constitutional:      General: She is not in acute distress.    Appearance: She is not ill-appearing or toxic-appearing.  HENT:     Head: Normocephalic and atraumatic.     Mouth/Throat:     Mouth: Mucous membranes are moist.     Pharynx: No oropharyngeal exudate or posterior oropharyngeal erythema.      Comments: Tooth missing and area appears to be healing well, there is a very tiny vesicle next to healing site but no other concerns for  swelling or purulence.  Eyes:     General: No scleral icterus.       Right eye: No discharge.        Left eye: No discharge.     Conjunctiva/sclera: Conjunctivae normal.    Cardiovascular:     Rate and Rhythm: Normal rate and regular rhythm.     Pulses: Normal pulses.     Heart sounds: No murmur heard. Pulmonary:     Effort: Pulmonary effort is normal. No respiratory distress.     Breath sounds: Normal breath sounds. No wheezing, rhonchi or rales.  Abdominal:     Palpations: Abdomen is soft.   Musculoskeletal:     Right lower leg: No edema.     Left lower leg: No edema.   Skin:    General: Skin is warm and dry.      Findings: No rash.   Neurological:     General: No focal deficit present.     Mental Status: She is alert and oriented to person, place, and time. Mental status is at baseline.     Comments: GCS 15. Speech is goal oriented. No deficits appreciated to CN III-XII; symmetric eyebrow raise, no facial drooping, tongue midline. Patient has equal grip strength bilaterally with 5/5 strength against resistance in all major muscle groups bilaterally. Sensation to light touch intact. Patient moves extremities without ataxia.    Psychiatric:        Mood and Affect: Mood normal.        Behavior: Behavior normal.     (all labs ordered are listed, but only abnormal results are displayed) Labs Reviewed  CBC - Abnormal; Notable for the following components:      Result Value   Platelets 423 (*)    All other components within normal limits  BASIC METABOLIC PANEL WITH GFR  PREGNANCY, URINE  TROPONIN T, HIGH SENSITIVITY  TROPONIN T, HIGH SENSITIVITY    EKG: EKG Interpretation Date/Time:  Sunday January 08 2024 10:37:20 EDT Ventricular Rate:  76 PR Interval:  142 QRS Duration:  80 QT Interval:  380 QTC Calculation: 428 R Axis:   79  Text Interpretation: Sinus rhythm since last tracing no significant change Confirmed by Lenor Hollering [phone]) on 01/08/2024 12:27:58 PM  Radiology: [facility] Chest 2 View Result Date: 01/08/2024 CLINICAL DATA:  chest pain EXAM: CHEST - 2 VIEW COMPARISON:  06/10/2017 FINDINGS: Lungs are clear.  No pneumothorax. Heart size and mediastinal contours are within normal limits. No effusion. Visualized bones unremarkable. IMPRESSION: No acute cardiopulmonary disease. Electronically Signed   By: JONETTA Faes M.D.   On: 01/08/2024 11:16   CT Maxillofacial Wo Contrast Result Date: 01/08/2024 CLINICAL DATA:  Blunt facial trauma. EXAM: CT MAXILLOFACIAL WITHOUT CONTRAST TECHNIQUE: Multidetector CT imaging of the maxillofacial structures was performed. Multiplanar CT image reconstructions  were also generated. RADIATION DOSE REDUCTION: This exam was performed according to the departmental dose-optimization program which includes automated exposure control, adjustment of the mA and/or kV according to patient size and/or use of iterative reconstruction technique. COMPARISON:  None Available. FINDINGS: Osseous: No acute fracture. Degenerative change of the temporomandibular joints bilaterally. Absence of several upper molar teeth worse on the left. Orbits: Negative. No traumatic or inflammatory finding. Sinuses: Paranasal sinuses are well developed. There is complete opacification of the left maxillary sinus and ostiomeatal complex mild opacification of the left ethmoid air cells and left frontoethmoidal recess. Mild opacification over the right sphenoid sinus. Soft tissues: No focal soft tissue injury. Limited intracranial: No  significant or unexpected finding. IMPRESSION: 1. No acute fracture. 2. Degenerative change of the temporomandibular joints bilaterally. 3. Findings likely due to chronic sinus inflammatory disease involving complete opacification of the left maxillary sinus and ostiomeatal complex. Electronically Signed   By: Toribio Agreste M.D.   On: 01/08/2024 11:02     Procedures   Medications Ordered in the ED  acetaminophen  (TYLENOL ) tablet 650 mg (650 mg Oral Given 01/08/24 1216)  diphenhydrAMINE (BENADRYL) injection 25 mg (25 mg Intravenous Given 01/08/24 1217)  metoCLOPramide (REGLAN) injection 10 mg (10 mg Intravenous Given 01/08/24 1223)  doxycycline (VIBRA-TABS) tablet 100 mg (100 mg Oral Given 01/08/24 1216)  dexamethasone  (DECADRON ) injection 8 mg (8 mg Intravenous Given 01/08/24 1220)                                    Medical Decision Making Amount and/or Complexity of Data Reviewed Labs: ordered. Radiology: ordered.  Risk OTC drugs. Prescription drug management.   This patient presents to the ED for concern of headache, this involves an extensive number of  treatment options, and is a complaint that carries with it a high risk of complications and morbidity.  The differential diagnosis includes migraine, tension headache, cluster headache, subarachnoid hemorrhage, meningitis/encephalitis, acute angle closure glaucoma, giant cell arteritis, idiopathic intercranial hypertension, ischemic stroke, ICH, cervical artery dissection   Co morbidities that complicate the patient evaluation  HLD, MDD, bradycardia   Additional history obtained:  Dr. Elizbeth PCP   Problem List / ED Course / Critical interventions / Medication management  Patient presents to ED with headache.  Patient believes headache is associated with her left sinus as she has been following with a dentist for a healing hole from her tooth to her maxillary sinus x3 weeks.  Physical exam with a well-healing left upper extraction site with a very tiny vesicle nearby.  Rest of physical exam reassuring.  Patient afebrile with stable vitals. Of note, patient stating I think I'm starting to have chest pain during initial interview. She believes it could be dt anxiety. I Ordered, and personally interpreted labs.  UPT negative.  CBC without leukocytosis or anemia.  BMP reassuring.  Delta troponin negative. I ordered imaging studies including chest x-ray, and CT maxillofacial. I independently visualized and interpreted imaging which showed left axillary sinusitis. I agree with the radiologist interpretation. Provided patient with migraine cocktail for headache.  Patient also received doxycycline for sinusitis.  Patient stating that she is not feeling better.  I stressed the importance of patient following up with her dentist and possibly ENT if this is not resolving symptom.  Patient verbalized understanding of plan.  Patient also to follow-up with PCP. I have reviewed the patients home medicines and have made adjustments as needed The patient has been appropriately medically screened and/or  stabilized in the ED. I have low suspicion for any other emergent medical condition which would require further screening, evaluation or treatment in the ED or require inpatient management. At time of discharge the patient is hemodynamically stable and in no acute distress. I have discussed work-up results and diagnosis with patient and answered all questions. Patient is agreeable with discharge plan. We discussed strict return precautions for returning to the emergency department and they verbalized understanding.      Social Determinants of Health:  none       Final diagnoses:  Acute non-recurrent maxillary sinusitis    ED Discharge  Orders          Ordered    doxycycline (VIBRAMYCIN) 100 MG capsule  2 times daily        01/08/24 1355               Hoy Nidia FALCON, [place] 01/08/24 1405    Lenor Hollering, MD 01/08/24 1430

## 2024-02-02 ENCOUNTER — Other Ambulatory Visit: Payer: Self-pay | Admitting: Obstetrics and Gynecology

## 2024-02-02 DIAGNOSIS — R232 Flushing: Secondary | ICD-10-CM

## 2024-02-08 ENCOUNTER — Other Ambulatory Visit: Payer: Self-pay | Admitting: Obstetrics and Gynecology

## 2024-02-08 DIAGNOSIS — R232 Flushing: Secondary | ICD-10-CM

## 2024-04-04 NOTE — Progress Notes (Unsigned)
   ANNUAL EXAM Patient name: Christina Stone MRN [id number]  Date of birth: 1980/12/16 Chief Complaint:   No chief complaint on file.  History of Present Illness:   Christina Stone is a 43 y.o. G74P2002 female being seen today for a routine annual exam.   Current concerns: *** Current birth control: BTS  No LMP recorded.   Last MXR: 12/15/2023, birad1, cat d  >>mgm and m. Aunt with h/o breast cancer  Last Pap/Pap History:  04/2018: pap wnl, hpv neg 03/2021 - LSIL, HPV neg 2023: wnl, HPV neg  Review of Systems:   Pertinent items are noted in HPI Denies any headaches, blurred vision, fatigue, shortness of breath, chest pain, abdominal pain, abnormal vaginal discharge/itching/odor/irritation, problems with periods, bowel movements, urination, or intercourse unless otherwise stated above. *** Pertinent History Reviewed:  Reviewed past medical,surgical, social and family history.  Reviewed problem list, medications and allergies. Physical Assessment:  There were no vitals filed for this visit.There is no height or weight on file to calculate BMI.   Physical Examination:  General appearance - well appearing, and in no distress Mental status - alert, oriented to person, place, and time Psych:  She has a normal mood and affect Skin - warm and dry, normal color, no suspicious lesions noted Chest - effort normal Heart - normal rate  Breasts - breasts appear normal, no suspicious masses, no skin or nipple changes or axillary nodes Abdomen - soft, nontender, nondistended, no masses or organomegaly Pelvic -  not indicated VULVA: Not examined VAGINA: Not examined CERVIX: Not examined UTERUS: Not examined ADNEXA: Not examined Extremities:  No swelling or varicosities noted  Chaperone present for exam  No results found for this or any previous visit (from the past 24 hours).  Assessment & Plan:  Diagnoses and all orders for this visit:  Encounter for annual routine gynecological  examination  Perimenopause  Abnormal cervical Papanicolaou smear, unspecified abnormal pap finding  Family history of breast cancer   - Cervical cancer screening: Discussed guidelines. Pap with HPV wnl 2023.  - STD Testing: {Blank single:19197::accepts,declines,not indicated} - Birth Control: BTS - Breast Health: Encouraged self breast awareness/SBE. Teaching provided. Discussed limits of clinical breast exam for detecting breast cancer. MXR is up to date: 12/15/23 - F/U 12 months and prn   Climara  patch (0.0375) and Prometrium  cyclic monthly but discussed spacing out if needed. ***  BRCA1/2 neg  No orders of the defined types were placed in this encounter.   Meds: No orders of the defined types were placed in this encounter.   Follow-up: No follow-ups on file.  Vina Solian, MD 04/04/2024 2:18 PM

## 2024-04-05 ENCOUNTER — Encounter: Payer: Self-pay | Admitting: Obstetrics and Gynecology

## 2024-04-05 ENCOUNTER — Ambulatory Visit (INDEPENDENT_AMBULATORY_CARE_PROVIDER_SITE_OTHER): Admitting: Obstetrics and Gynecology

## 2024-04-05 VITALS — BP 118/73 | HR 71 | Ht 64.0 in | Wt 126.0 lb

## 2024-04-05 DIAGNOSIS — Z01419 Encounter for gynecological examination (general) (routine) without abnormal findings: Secondary | ICD-10-CM

## 2024-04-05 DIAGNOSIS — R87619 Unspecified abnormal cytological findings in specimens from cervix uteri: Secondary | ICD-10-CM

## 2024-04-05 DIAGNOSIS — Z803 Family history of malignant neoplasm of breast: Secondary | ICD-10-CM

## 2024-04-05 DIAGNOSIS — R232 Flushing: Secondary | ICD-10-CM | POA: Diagnosis not present

## 2024-04-05 DIAGNOSIS — N951 Menopausal and female climacteric states: Secondary | ICD-10-CM

## 2024-04-05 MED ORDER — PROGESTERONE 200 MG PO CAPS
200.0000 mg | ORAL_CAPSULE | Freq: Every day | ORAL | 3 refills | Status: AC
Start: 2024-04-05 — End: ?

## 2024-04-05 MED ORDER — ESTRADIOL 0.0375 MG/24HR TD PTWK: 0.0375 mg | MEDICATED_PATCH | TRANSDERMAL | 3 refills | Status: DC

## 2024-08-15 ENCOUNTER — Encounter: Payer: Self-pay | Admitting: Obstetrics and Gynecology

## 2024-08-15 DIAGNOSIS — R232 Flushing: Secondary | ICD-10-CM

## 2024-08-15 MED ORDER — ESTRADIOL 0.05 MG/24HR TD PTWK: 0.0500 mg | MEDICATED_PATCH | TRANSDERMAL | 3 refills | Status: DC

## 2024-08-16 MED ORDER — ESTRADIOL 0.0375 MG/24HR TD PTTW: 1.0000 | MEDICATED_PATCH | TRANSDERMAL | 12 refills | Status: AC

## 2024-08-16 NOTE — Addendum Note (Signed)
 Addended by: CLEATUS MOCCASIN A on: 08/16/2024 07:48 AM   Modules accepted: Orders
